# Patient Record
Sex: Female | Born: 1955 | Race: White | Hispanic: No | Smoking: Former smoker
Health system: Southern US, Community
[De-identification: ages and names within clinical notes are randomized; demographics above are authoritative.]

## PROBLEM LIST (undated history)

## (undated) DIAGNOSIS — I10 Essential (primary) hypertension: Secondary | ICD-10-CM

## (undated) DIAGNOSIS — G43909 Migraine, unspecified, not intractable, without status migrainosus: Secondary | ICD-10-CM

---

## 1999-03-10 ENCOUNTER — Ambulatory Visit (HOSPITAL_COMMUNITY): Admission: RE | Admit: 1999-03-10 | Discharge: 1999-03-10 | Payer: Self-pay | Admitting: *Deleted

## 2002-04-25 ENCOUNTER — Other Ambulatory Visit: Admission: RE | Admit: 2002-04-25 | Discharge: 2002-04-25 | Payer: Self-pay | Admitting: Family Medicine

## 2014-02-09 ENCOUNTER — Emergency Department (HOSPITAL_COMMUNITY): Payer: Self-pay

## 2014-02-09 ENCOUNTER — Inpatient Hospital Stay (HOSPITAL_COMMUNITY)
Admission: EM | Admit: 2014-02-09 | Discharge: 2014-02-11 | DRG: 066 | Disposition: A | Discharge status: Home / Self Care | Payer: Self-pay | Attending: Internal Medicine | Admitting: Internal Medicine

## 2014-02-09 ENCOUNTER — Encounter (HOSPITAL_COMMUNITY): Payer: Self-pay | Admitting: Emergency Medicine

## 2014-02-09 DIAGNOSIS — R209 Unspecified disturbances of skin sensation: Secondary | ICD-10-CM

## 2014-02-09 DIAGNOSIS — E785 Hyperlipidemia, unspecified: Secondary | ICD-10-CM

## 2014-02-09 DIAGNOSIS — I6350 Cerebral infarction due to unspecified occlusion or stenosis of unspecified cerebral artery: Secondary | ICD-10-CM

## 2014-02-09 DIAGNOSIS — M6281 Muscle weakness (generalized): Secondary | ICD-10-CM

## 2014-02-09 DIAGNOSIS — I10 Essential (primary) hypertension: Secondary | ICD-10-CM

## 2014-02-09 DIAGNOSIS — IMO0001 Reserved for inherently not codable concepts without codable children: Secondary | ICD-10-CM

## 2014-02-09 DIAGNOSIS — I6789 Other cerebrovascular disease: Secondary | ICD-10-CM | POA: Diagnosis present

## 2014-02-09 DIAGNOSIS — I639 Cerebral infarction, unspecified: Secondary | ICD-10-CM | POA: Insufficient documentation

## 2014-02-09 DIAGNOSIS — Z79899 Other long term (current) drug therapy: Secondary | ICD-10-CM

## 2014-02-09 DIAGNOSIS — R269 Unspecified abnormalities of gait and mobility: Secondary | ICD-10-CM | POA: Diagnosis present

## 2014-02-09 DIAGNOSIS — D72829 Elevated white blood cell count, unspecified: Secondary | ICD-10-CM | POA: Diagnosis present

## 2014-02-09 DIAGNOSIS — Z87891 Personal history of nicotine dependence: Secondary | ICD-10-CM

## 2014-02-09 DIAGNOSIS — M791 Myalgia, unspecified site: Secondary | ICD-10-CM

## 2014-02-09 DIAGNOSIS — E876 Hypokalemia: Secondary | ICD-10-CM | POA: Diagnosis present

## 2014-02-09 HISTORY — DX: Migraine, unspecified, not intractable, without status migrainosus: G43.909

## 2014-02-09 HISTORY — DX: Essential (primary) hypertension: I10

## 2014-02-09 LAB — I-STAT CHEM 8, ED
BUN: 13 mg/dL (ref 6–23)
CALCIUM ION: 1.16 mmol/L (ref 1.12–1.23)
CREATININE: 0.9 mg/dL (ref 0.50–1.10)
Chloride: 96 mEq/L (ref 96–112)
GLUCOSE: 107 mg/dL — AB (ref 70–99)
HCT: 48 % — ABNORMAL HIGH (ref 36.0–46.0)
HEMOGLOBIN: 16.3 g/dL — AB (ref 12.0–15.0)
Potassium: 3.2 mEq/L — ABNORMAL LOW (ref 3.7–5.3)
Sodium: 139 mEq/L (ref 137–147)
TCO2: 27 mmol/L (ref 0–100)

## 2014-02-09 LAB — COMPREHENSIVE METABOLIC PANEL
ALT: 24 U/L (ref 0–35)
AST: 16 U/L (ref 0–37)
Albumin: 3.8 g/dL (ref 3.5–5.2)
Alkaline Phosphatase: 115 U/L (ref 39–117)
BUN: 13 mg/dL (ref 6–23)
CALCIUM: 9.7 mg/dL (ref 8.4–10.5)
CO2: 29 mEq/L (ref 19–32)
Chloride: 96 mEq/L (ref 96–112)
Creatinine, Ser: 0.82 mg/dL (ref 0.50–1.10)
GFR calc non Af Amer: 79 mL/min — ABNORMAL LOW (ref >90)
GLUCOSE: 106 mg/dL — AB (ref 70–99)
POTASSIUM: 3.3 meq/L — AB (ref 3.7–5.3)
Sodium: 138 mEq/L (ref 137–147)
Total Bilirubin: 0.7 mg/dL (ref 0.3–1.2)
Total Protein: 7.9 g/dL (ref 6.0–8.3)

## 2014-02-09 LAB — DIFFERENTIAL
Basophils Absolute: 0 10*3/uL (ref 0.0–0.1)
Basophils Relative: 0 % (ref 0–1)
EOS PCT: 1 % (ref 0–5)
Eosinophils Absolute: 0.1 10*3/uL (ref 0.0–0.7)
LYMPHS ABS: 2.1 10*3/uL (ref 0.7–4.0)
LYMPHS PCT: 16 % (ref 12–46)
MONOS PCT: 6 % (ref 3–12)
Monocytes Absolute: 0.8 10*3/uL (ref 0.1–1.0)
Neutro Abs: 10.2 10*3/uL — ABNORMAL HIGH (ref 1.7–7.7)
Neutrophils Relative %: 78 % — ABNORMAL HIGH (ref 43–77)

## 2014-02-09 LAB — URINALYSIS, ROUTINE W REFLEX MICROSCOPIC
Bilirubin Urine: NEGATIVE
Glucose, UA: NEGATIVE mg/dL
Ketones, ur: NEGATIVE mg/dL
LEUKOCYTES UA: NEGATIVE
Nitrite: NEGATIVE
Protein, ur: 30 mg/dL — AB
Specific Gravity, Urine: 1.013 (ref 1.005–1.030)
Urobilinogen, UA: 0.2 mg/dL (ref 0.0–1.0)
pH: 6.5 (ref 5.0–8.0)

## 2014-02-09 LAB — CBC
HEMATOCRIT: 44.4 % (ref 36.0–46.0)
HEMOGLOBIN: 15.2 g/dL — AB (ref 12.0–15.0)
MCH: 29 pg (ref 26.0–34.0)
MCHC: 34.2 g/dL (ref 30.0–36.0)
MCV: 84.6 fL (ref 78.0–100.0)
PLATELETS: 274 10*3/uL (ref 150–400)
RBC: 5.25 MIL/uL — AB (ref 3.87–5.11)
RDW: 14.8 % (ref 11.5–15.5)
WBC: 13.1 10*3/uL — AB (ref 4.0–10.5)

## 2014-02-09 LAB — I-STAT TROPONIN, ED: Troponin i, poc: 0.02 ng/mL (ref 0.00–0.08)

## 2014-02-09 LAB — URINE MICROSCOPIC-ADD ON

## 2014-02-09 LAB — APTT: aPTT: 32 seconds (ref 24–37)

## 2014-02-09 LAB — RAPID URINE DRUG SCREEN, HOSP PERFORMED
Amphetamines: NOT DETECTED
Barbiturates: NOT DETECTED
Benzodiazepines: NOT DETECTED
Cocaine: NOT DETECTED
OPIATES: NOT DETECTED
Tetrahydrocannabinol: NOT DETECTED

## 2014-02-09 LAB — PROTIME-INR
INR: 0.96 (ref 0.00–1.49)
Prothrombin Time: 12.6 seconds (ref 11.6–15.2)

## 2014-02-09 LAB — ETHANOL: Alcohol, Ethyl (B): <11 mg/dL (ref 0–11)

## 2014-02-09 LAB — CBG MONITORING, ED: GLUCOSE-CAPILLARY: 119 mg/dL — AB (ref 70–99)

## 2014-02-09 MED ORDER — ENOXAPARIN SODIUM 40 MG/0.4ML ~~LOC~~ SOLN
40.0000 mg | SUBCUTANEOUS | Status: DC
Start: 2014-02-10 — End: 2014-02-11
  Administered 2014-02-10 – 2014-02-11 (×2): 40 mg via SUBCUTANEOUS
  Filled 2014-02-09 (×2): qty 0.4

## 2014-02-09 MED ORDER — LORAZEPAM 2 MG/ML IJ SOLN
1.0000 mg | INTRAMUSCULAR | Status: DC | PRN
  Administered 2014-02-09: 1 mg via INTRAVENOUS
  Filled 2014-02-09: qty 1

## 2014-02-09 MED ORDER — INFLUENZA VAC SPLIT QUAD 0.5 ML IM SUSP
0.5000 mL | INTRAMUSCULAR | Status: AC
  Administered 2014-02-10: 0.5 mL via INTRAMUSCULAR
  Filled 2014-02-09: qty 0.5

## 2014-02-09 MED ORDER — ACETAMINOPHEN 325 MG PO TABS: 650.0000 mg | ORAL_TABLET | ORAL | Status: DC | PRN

## 2014-02-09 MED ORDER — POTASSIUM CHLORIDE CRYS ER 20 MEQ PO TBCR
40.0000 meq | EXTENDED_RELEASE_TABLET | Freq: Once | ORAL | Status: AC
  Administered 2014-02-10: 40 meq via ORAL
  Filled 2014-02-09: qty 2

## 2014-02-09 MED ORDER — ACETAMINOPHEN 650 MG RE SUPP: 650.0000 mg | RECTAL | Status: DC | PRN

## 2014-02-09 MED ORDER — ASPIRIN EC 325 MG PO TBEC
325.0000 mg | DELAYED_RELEASE_TABLET | Freq: Every day | ORAL | Status: DC
  Administered 2014-02-10 – 2014-02-11 (×2): 325 mg via ORAL
  Filled 2014-02-09 (×2): qty 1

## 2014-02-09 MED ORDER — SIMVASTATIN 20 MG PO TABS
20.0000 mg | ORAL_TABLET | Freq: Every day | ORAL | Status: DC
  Filled 2014-02-09: qty 1

## 2014-02-09 MED ORDER — ASPIRIN 81 MG PO CHEW
324.0000 mg | CHEWABLE_TABLET | Freq: Once | ORAL | Status: AC
  Administered 2014-02-09: 324 mg via ORAL
  Filled 2014-02-09: qty 4

## 2014-02-09 MED ORDER — SENNOSIDES-DOCUSATE SODIUM 8.6-50 MG PO TABS: 1.0000 | ORAL_TABLET | Freq: Every evening | ORAL | Status: DC | PRN

## 2014-02-09 NOTE — ED Provider Notes (Signed)
CSN: [id number]     Arrival date & time 02/09/14  1310 History   First MD Initiated Contact with Patient 02/09/14 1350     Chief Complaint  Patient presents with  . Extremity Weakness     HPI  Patient presents with left upper extremity numbness, and left leg "coordination problem".  Her last time no normal was 9:30 this morning. She awakened prior to this. She had made and eaten breakfast. She then laid down to read which is her norm. At 9:30 she got up. She states she had difficulty with feeling in her arm. Also felt that it felt a little clumsy to her. By about 12:15 she was describing difficulty with use of her legs. Describing them "like they were drunk". She now states this was almost exclusively her left lower extremity and her right leg is held normal. She called paramedics. She was transferred here. She states that she was able to ambulate to the ambulance. However, upon arrival here she states she had to be brought in in a wheelchair. Since arrival here, she has been able to ambulate to the bathroom and states that her symptoms are better but not resolved. She has a slight headache like a rubber band over the top of her head. No palpitations. No head or neck injuries. No chest pain or palpitations. History of an episode of weakness with a migraine headache in her 75s. No similar symptoms since. She had a mild headache and she considered a migraine on Monday that she stayed home from work with.  Past Medical History  Diagnosis Date  . Migraine   . Hypertension    History reviewed. No pertinent past surgical history. Family History  Problem Relation Age of Onset  . Migraines Mother   . Hypertension Mother   . Hypertension Father    History  Substance Use Topics  . Smoking status: Former Games developer  . Smokeless tobacco: Never Used  . Alcohol Use: Yes     Comment: occasional   OB History   Grav Para Term Preterm Abortions TAB SAB Ect Mult Living                 Review of Systems   Constitutional: Negative for fever, chills, diaphoresis, appetite change and fatigue.  HENT: Negative for mouth sores, sore throat and trouble swallowing.   Eyes: Negative for visual disturbance.  Respiratory: Negative for cough, chest tightness, shortness of breath and wheezing.   Cardiovascular: Negative for chest pain.  Gastrointestinal: Negative for nausea, vomiting, abdominal pain, diarrhea and abdominal distention.  Endocrine: Negative for polydipsia, polyphagia and polyuria.  Genitourinary: Negative for dysuria, frequency and hematuria.  Musculoskeletal: Negative for gait problem.  Skin: Negative for color change, pallor and rash.  Neurological: Positive for weakness, numbness and headaches. Negative for dizziness, syncope and light-headedness.  Hematological: Does not bruise/bleed easily.  Psychiatric/Behavioral: Negative for behavioral problems and confusion.      Allergies  Review of patient's allergies indicates no known allergies.  Home Medications   No current outpatient prescriptions on file. BP 144/92  Pulse 103  Temp(Src) 98 F (36.7 C) (Oral)  Resp 18  Ht 5' (1.524 m)  Wt 175 lb 14.4 oz (79.788 kg)  BMI 34.35 kg/m2  SpO2 97%  LMP 11/28/2013 Physical Exam  Constitutional: She is oriented to person, place, and time. She appears well-developed and well-nourished. No distress.  HENT:  Head: Normocephalic.  Eyes: Conjunctivae are normal. Pupils are equal, round, and reactive to light. No  scleral icterus.  Neck: Normal range of motion. Neck supple. No thyromegaly present.  Cardiovascular: Normal rate and regular rhythm.  Exam reveals no gallop and no friction rub.   No murmur heard. Pulmonary/Chest: Effort normal and breath sounds normal. No respiratory distress. She has no wheezes. She has no rales.  Abdominal: Soft. Bowel sounds are normal. She exhibits no distension. There is no tenderness. There is no rebound.  Musculoskeletal: Normal range of motion.   Neurological: She is alert and oriented to person, place, and time. A sensory deficit is present. Coordination and gait abnormal.  Cranial nerves intact symmetric. No carotid bruits. Subtle left upper extremity pronator drift. Describes numbness in her left fourth digit. Normal rapid alternating movement of her extremities. She has normal gross strength of her lower extremities symmetrically. She has difficulty with cerebellar function of her left leg. With attempted gait she has great difficulty with tandem gait, and ataxia with ambulation.  Skin: Skin is warm and dry. No rash noted.  Psychiatric: She has a normal mood and affect. Her behavior is normal.    ED Course  Procedures (including critical care time) Labs Review Labs Reviewed  CBC - Abnormal; Notable for the following:    WBC 13.1 (*)    RBC 5.25 (*)    Hemoglobin 15.2 (*)    All other components within normal limits  DIFFERENTIAL - Abnormal; Notable for the following:    Neutrophils Relative % 78 (*)    Neutro Abs 10.2 (*)    All other components within normal limits  COMPREHENSIVE METABOLIC PANEL - Abnormal; Notable for the following:    Potassium 3.3 (*)    Glucose, Bld 106 (*)    GFR calc non Af Amer 79 (*)    All other components within normal limits  URINALYSIS, ROUTINE W REFLEX MICROSCOPIC - Abnormal; Notable for the following:    Hgb urine dipstick SMALL (*)    Protein, ur 30 (*)    All other components within normal limits  URINE MICROSCOPIC-ADD ON - Abnormal; Notable for the following:    Squamous Epithelial / LPF FEW (*)    Bacteria, UA FEW (*)    Casts HYALINE CASTS (*)    All other components within normal limits  HEMOGLOBIN A1C - Abnormal; Notable for the following:    Hemoglobin A1C 6.0 (*)    Mean Plasma Glucose 126 (*)    All other components within normal limits  LIPID PANEL - Abnormal; Notable for the following:    Cholesterol 247 (*)    LDL Cholesterol 164 (*)    All other components within  normal limits  CBC WITH DIFFERENTIAL - Abnormal; Notable for the following:    WBC 12.0 (*)    RBC 5.12 (*)    Neutro Abs 7.9 (*)    All other components within normal limits  COMPREHENSIVE METABOLIC PANEL - Abnormal; Notable for the following:    Potassium 3.4 (*)    Chloride 95 (*)    Glucose, Bld 117 (*)    Albumin 3.3 (*)    GFR calc non Af Amer 79 (*)    All other components within normal limits  C-REACTIVE PROTEIN - Abnormal; Notable for the following:    CRP 2.0 (*)    All other components within normal limits  I-STAT CHEM 8, ED - Abnormal; Notable for the following:    Potassium 3.2 (*)    Glucose, Bld 107 (*)    Hemoglobin 16.3 (*)  HCT 48.0 (*)    All other components within normal limits  CBG MONITORING, ED - Abnormal; Notable for the following:    Glucose-Capillary 119 (*)    All other components within normal limits  ETHANOL  PROTIME-INR  URINE RAPID DRUG SCREEN (HOSP PERFORMED)  APTT  PROTIME-INR  SEDIMENTATION RATE  ANA  I-STAT TROPOININ, ED   Imaging Review Ct Head Wo Contrast  02/09/2014   CLINICAL DATA:  Left-sided weakness  EXAM: CT HEAD WITHOUT CONTRAST  TECHNIQUE: Contiguous axial images were obtained from the base of the skull through the vertex without intravenous contrast.  COMPARISON:  None.  FINDINGS: The brainstem, cerebellum, cerebral peduncles, thalamus, basal ganglia, basilar cisterns, and ventricular system appear within normal limits. No intracranial hemorrhage, mass lesion, or acute CVA.  IMPRESSION: No significant abnormality identified.   Electronically Signed   By: Herbie BaltimoreWalt  Liebkemann M.D.   On: 02/09/2014 15:40   Mr Maxine GlennMra Head Wo Contrast  02/09/2014   CLINICAL DATA:  Headache. Hypertension. Left lower extremity weakness.  EXAM: MRI HEAD WITHOUT CONTRAST  MRA HEAD WITHOUT CONTRAST  TECHNIQUE: Multiplanar, multiecho pulse sequences of the brain and surrounding structures were obtained without intravenous contrast. Angiographic images of the head  were obtained using MRA technique without contrast.  COMPARISON:  CT head without contrast 02/09/2014.  FINDINGS: MRI HEAD FINDINGS  An acute nonhemorrhagic infarct is present within the white matter of the posterior right corona radiata. This likely corresponds to the cortical spinal tracts. There is no associated hemorrhage. Minimal white matter changes evident. No other significant white matter disease is present. Midline structures are within normal limits.  Flow is present in the major intracranial arteries. The globes and orbits are intact. The paranasal sinuses and mastoid air cells are clear.  MRA HEAD FINDINGS  The internal carotid arteries are within normal limits from the high cervical segments through the ICA termini bilaterally. The A1 and M1 segments are normal. The anterior communicating artery is patent. The MCA bifurcations are within normal limits. MCA and ACA branch vessels are normal bilaterally.  The right vertebral artery is dominant. The PICA origins are visualized and normal bilaterally. A prominent right AICA is present. The basilar artery is within normal limits. Both posterior cerebral arteries originate from the basilar tip. There is mild attenuation of right PCA branch vessels.  IMPRESSION: 1. Acute nonhemorrhagic infarct involving the white matter in the posterior right corona radiata, likely affecting the cortical spinal tracts. 2. No other significant infarct or white matter disease. 3. Minimal distal small vessel disease.  Critical Value/emergent results were called by telephone at the time of interpretation on 02/09/2014 at 6:37 PM to Dr. Rolland PorterMARK Kolten Ryback , who verbally acknowledged these results.   Electronically Signed   By: Gennette Pachris  Mattern M.D.   On: 02/09/2014 18:37   Mr Brain Wo Contrast  02/09/2014   CLINICAL DATA:  Headache. Hypertension. Left lower extremity weakness.  EXAM: MRI HEAD WITHOUT CONTRAST  MRA HEAD WITHOUT CONTRAST  TECHNIQUE: Multiplanar, multiecho pulse sequences of  the brain and surrounding structures were obtained without intravenous contrast. Angiographic images of the head were obtained using MRA technique without contrast.  COMPARISON:  CT head without contrast 02/09/2014.  FINDINGS: MRI HEAD FINDINGS  An acute nonhemorrhagic infarct is present within the white matter of the posterior right corona radiata. This likely corresponds to the cortical spinal tracts. There is no associated hemorrhage. Minimal white matter changes evident. No other significant white matter disease is present. Midline structures are  within normal limits.  Flow is present in the major intracranial arteries. The globes and orbits are intact. The paranasal sinuses and mastoid air cells are clear.  MRA HEAD FINDINGS  The internal carotid arteries are within normal limits from the high cervical segments through the ICA termini bilaterally. The A1 and M1 segments are normal. The anterior communicating artery is patent. The MCA bifurcations are within normal limits. MCA and ACA branch vessels are normal bilaterally.  The right vertebral artery is dominant. The PICA origins are visualized and normal bilaterally. A prominent right AICA is present. The basilar artery is within normal limits. Both posterior cerebral arteries originate from the basilar tip. There is mild attenuation of right PCA branch vessels.  IMPRESSION: 1. Acute nonhemorrhagic infarct involving the white matter in the posterior right corona radiata, likely affecting the cortical spinal tracts. 2. No other significant infarct or white matter disease. 3. Minimal distal small vessel disease.  Critical Value/emergent results were called by telephone at the time of interpretation on 02/09/2014 at 6:37 PM to Dr. Rolland Porter , who verbally acknowledged these results.   Electronically Signed   By: Gennette Pac M.D.   On: 02/09/2014 18:37     EKG Interpretation   Date/Time:  Friday February 09 2014 16:01:58 EST Ventricular Rate:  89 PR  Interval:  116 QRS Duration: 90 QT Interval:  381 QTC Calculation: 464 R Axis:   54 Text Interpretation:  Sinus rhythm  No  ectopy No injury Confirmed by  Fayrene Fearing  MD, Carrington Mullenax ([id number]) on 02/09/2014 4:18:20 PM      MDM   Final diagnoses:  CVA (cerebral infarction)    MRI shows CVA.  Discussed with Neurology, and [facility].  Pt seen by Dr. Leroy Kennedy after arrival.  Pt not TPA candidate because out of therapeutic window.    Rolland Porter, MD 02/10/14 [phone]

## 2014-02-09 NOTE — ED Notes (Signed)
Pt presents via [facility] from home with c/o feeling numbness and tingling in bilateral legs starting around 9:30am.  Pt states she had a "drunk feeling" in her legs.

## 2014-02-09 NOTE — ED Notes (Signed)
MRA ordered due to MRI showing stroke.

## 2014-02-09 NOTE — ED Notes (Signed)
Pt reporting a headache.  Sts this headache does not feel like a migraine.

## 2014-02-09 NOTE — ED Notes (Signed)
Pt mildly anxious about MRI.  Pt to be premedicated before MRI and accompanied by RN to MRI if pt needs more medication during exam.  Pt verbalized understanding of plan of care.

## 2014-02-09 NOTE — ED Notes (Signed)
Pt transported to MRI 

## 2014-02-09 NOTE — Consult Note (Signed)
Referring Physician: Fayrene Fearing    Chief Complaint: Left sided numbness  HPI:                                                                                                                                         Olivia Jefferson is an 58 y.o. female who has a history of complicated migraine 20 years ago.  At that time the symptoms consisted of left arm weakness and bilateral leg weakness. Today she awoke feeling normal but around 0930 she noted her left arm was "tingling" and around 10:10 she noted she felt off balance and left leg seemed slightly "tingly".  Due to these symptoms she called EMS.  On arrival patient was brought to CT scanner where code stroke was called.  Currently she still feels her left arm and leg have decreased sensation but on formal testing she felt they were the same.  NIHSS 0. Patient is very anxious.   Date last known well: Date: 02/09/2014 Time last known well: Time: 09:30 tPA Given: No: out of the window  Past Medical History  Diagnosis Date  . Migraine     History reviewed. No pertinent past surgical history.  Family History  Problem Relation Age of Onset  . Migraines Mother   . Hypertension Mother   . Hypertension Father    Social History:  reports that she has quit smoking. She has never used smokeless tobacco. She reports that she drinks alcohol. She reports that she does not use illicit drugs.  Allergies: No Known Allergies  Medications:                                                                                                                           No current facility-administered medications for this encounter.   Current Outpatient Prescriptions  Medication Sig Dispense Refill  . triamterene-hydrochlorothiazide (DYAZIDE) 37.5-25 MG per capsule Take 1 capsule by mouth daily.         ROS:  History obtained from  the patient  General ROS: negative for - chills, fatigue, fever, night sweats, weight gain or weight loss Psychological ROS: negative for - behavioral disorder, hallucinations, memory difficulties, mood swings or suicidal ideation Ophthalmic ROS: negative for - blurry vision, double vision, eye pain or loss of vision ENT ROS: negative for - epistaxis, nasal discharge, oral lesions, sore throat, tinnitus or vertigo Allergy and Immunology ROS: negative for - hives or itchy/watery eyes Hematological and Lymphatic ROS: negative for - bleeding problems, bruising or swollen lymph nodes Endocrine ROS: negative for - galactorrhea, hair pattern changes, polydipsia/polyuria or temperature intolerance Respiratory ROS: negative for - cough, hemoptysis, shortness of breath or wheezing Cardiovascular ROS: negative for - chest pain, dyspnea on exertion, edema or irregular heartbeat Gastrointestinal ROS: negative for - abdominal pain, diarrhea, hematemesis, nausea/vomiting or stool incontinence Genito-Urinary ROS: negative for - dysuria, hematuria, incontinence or urinary frequency/urgency Musculoskeletal ROS: negative for - joint swelling or muscular weakness Neurological ROS: as noted in HPI Dermatological ROS: negative for rash and skin lesion changes  Neurologic Examination:                                                                                                      Blood pressure 188/93, pulse 91, resp. rate 16, height 5' (1.524 m), weight 74.844 kg (165 lb), last menstrual period 11/28/2013, SpO2 97.00%.   Mental Status: Alert, oriented, thought content appropriate.  Speech fluent without evidence of aphasia.  Able to follow 3 step commands without difficulty. Cranial Nerves: II: Discs flat bilaterally; Visual fields grossly normal, pupils equal, round, reactive to light and accommodation III,IV, VI: ptosis not present, extra-ocular motions intact bilaterally V,VII: smile symmetric, facial  light touch sensation normal bilaterally VIII: hearing normal bilaterally IX,X: gag reflex present XI: bilateral shoulder shrug XII: midline tongue extension without atrophy or fasciculations  Motor: Right : Upper extremity   5/5    Left:     Upper extremity   5/5  Lower extremity   5/5     Lower extremity   5/5 Tone and bulk:normal tone throughout; no atrophy noted Sensory: Pinprick and light touch intact throughout, bilaterally Deep Tendon Reflexes:  Right: Upper Extremity   Left: Upper extremity   biceps (C-5 to C-6) 2/4   biceps (C-5 to C-6) 2/4 tricep (C7) 2/4    triceps (C7) 2/4 Brachioradialis (C6) 2/4  Brachioradialis (C6) 2/4  Lower Extremity Lower Extremity  quadriceps (L-2 to L-4) 2/4   quadriceps (L-2 to L-4) 2/4 Achilles (S1) 2/4   Achilles (S1) 2/4  Plantars: Right: downgoing   Left: downgoing Cerebellar: normal finger-to-nose,  normal heel-to-shin test Gait: slightly ataxic CV: pulses palpable throughout    Lab Results: Basic Metabolic Panel:  Recent Labs Lab 02/09/14 1545  NA 139  K 3.2*  CL 96  GLUCOSE 107*  BUN 13  CREATININE 0.90    Liver Function Tests: No results found for this basename: AST, ALT, ALKPHOS, BILITOT, PROT, ALBUMIN,  in the last 168 hours No results found for this basename: LIPASE, AMYLASE,  in the last  168 hours No results found for this basename: AMMONIA,  in the last 168 hours  CBC:  Recent Labs Lab 02/09/14 1545  HGB 16.3*  HCT 48.0*    Cardiac Enzymes: No results found for this basename: CKTOTAL, CKMB, CKMBINDEX, TROPONINI,  in the last 168 hours  Lipid Panel: No results found for this basename: CHOL, TRIG, HDL, CHOLHDL, VLDL, LDLCALC,  in the last 168 hours  CBG: No results found for this basename: GLUCAP,  in the last 168 hours  Microbiology: No results found for this or any previous visit.  Coagulation Studies: No results found for this basename: LABPROT, INR,  in the last 72 hours  Imaging: Ct Head  Wo Contrast  02/09/2014   CLINICAL DATA:  Left-sided weakness  EXAM: CT HEAD WITHOUT CONTRAST  TECHNIQUE: Contiguous axial images were obtained from the base of the skull through the vertex without intravenous contrast.  COMPARISON:  None.  FINDINGS: The brainstem, cerebellum, cerebral peduncles, thalamus, basal ganglia, basilar cisterns, and ventricular system appear within normal limits. No intracranial hemorrhage, mass lesion, or acute CVA.  IMPRESSION: No significant abnormality identified.   Electronically Signed   By: Herbie Baltimore M.D.   On: 02/09/2014 15:40       Assessment and plan discussed with with attending physician and they are in agreement.    Felicie Morn PA-C [facility] [phone]  02/09/2014, 3:54 PM   Assessment: 58 y.o. female with onset of left arm and leg tingling which progressed to gait imbalance.  Patient does report having migraine HA (complicated) which presented much like today's symptoms but today she did not have floaters. Exam is non-focal with no lateralizing or localizing symptoms.  Likely a complicated migraine HA with component of anxiety however cannot rule out CVA.    Stroke Risk Factors - none  Recommend: 1) MRI brain to evaluate for CVA.  If negative would not continue with stroke work up.  2) If MRI is positive would continue with stroke work up and have hospitalist admit.    Patient seen and examined together with physician assistant and I concur with the assessment and plan.  Wyatt Portela, MD

## 2014-02-09 NOTE — ED Notes (Signed)
Pt back from CT

## 2014-02-09 NOTE — ED Notes (Signed)
Pt states she feels heavy in her left leg, "I feel clumsy".  Strength is moderate to strong, able to follow commands, absent drift bilateral extremities.

## 2014-02-09 NOTE — H&P (Signed)
[facility] History and Physical  Patient: Olivia Jefferson  SFK:[id number]  DOB: 1956/08/29  DOS: the patient was seen and examined on 02/09/2014 PCP: Hulen Shouts, MD  Chief Complaint: left sided numbness  HPI: Olivia Jefferson is a 58 y.o. female with Past medical history of hypertension, dyslipidemia, myalgia on statin. The patient is coming from home. The pt presented with complain of left sided numbness, that she noted earlier this morning at 9:30 AM. When she woke up she was at her baseline and did not have any complaint. Her symptoms initially started in the left upper extremity with numbness and she tried to shake it off but his symptoms were not getting better and then while she was going to the bathroom she noticed that she had difficulty ambulating due to weakness and numbness in her legs. She denied any dizziness, headache, blurring of vision, difficulty swallowing, chest pain, palpitation, shortness of breath, fever, chills, burning urination, diarrhea, rash. She has history of rosacea and has photosensitivity. She denies any similar symptoms in the past. She has history of hypertension and is taking her medication, she was on pravastatin which she stopped a few months ago due to myalgia and generalized fatigue and difficulty maintaining coordination and declining cognitive function. She after stopping the medication her symptoms improved. She has family history of strokes.  Review of Systems: as mentioned in the history of present illness.  A Comprehensive review of the other systems is negative.  Past Medical History  Diagnosis Date  . Migraine   . Hypertension    History reviewed. No pertinent past surgical history. Social History:  reports that she has quit smoking. She has never used smokeless tobacco. She reports that she drinks alcohol. She reports that she does not use illicit drugs. Independent for most of her  ADL.  No Known Allergies  Family History   Problem Relation Age of Onset  . Migraines Mother   . Hypertension Mother   . Hypertension Father     Prior to Admission medications   Medication Sig Start Date End Date Taking? Authorizing Provider  triamterene-hydrochlorothiazide (DYAZIDE) 37.5-25 MG per capsule Take 1 capsule by mouth daily.   Yes Historical Provider, MD    Physical Exam: Filed Vitals:   02/09/14 1805 02/09/14 1830 02/09/14 1850 02/09/14 1900  BP: 153/92 151/88 147/95 145/90  Pulse: 94 94  96  Resp: $Remo'19 21 15 23  'RQVIK$ Height:      Weight:      SpO2: 96% 94% 96% 95%    General: Alert, Awake and Oriented to Time, Place and Person. Appear in mild distress Eyes: PERRL ENT: Oral Mucosa clear moist. Neck: no JVD Cardiovascular: S1 and S2 Present, no Murmur, Peripheral Pulses Present Respiratory: Bilateral Air entry equal and Decreased, Clear to Auscultation,  no Crackles,no wheezes Abdomen: Bowel Sound Present, Soft and Non tender Skin: no Rash Extremities: no Pedal edema, no calf tenderness Neurologic: Alert awake and oriented appropriate speech, Cranial Nerves pupils are reactive extraocular muscle movement intact, Motor strength bilaterally equal strength, Sensation present to light touch bilaterally, reflexes intact bilaterally, babinski negative, Proprioception normal, Cerebellar test normal finger-nose-finger with difficulty on the left.  Labs on Admission:  CBC:  Recent Labs Lab 02/09/14 1417 02/09/14 1545  WBC 13.1*  --   NEUTROABS 10.2*  --   HGB 15.2* 16.3*  HCT 44.4 48.0*  MCV 84.6  --   PLT 274  --     CMP     Component Value  Date/Time   NA 139 02/09/2014 1545   K 3.2* 02/09/2014 1545   CL 96 02/09/2014 1545   CO2 29 02/09/2014 1417   GLUCOSE 107* 02/09/2014 1545   BUN 13 02/09/2014 1545   CREATININE 0.90 02/09/2014 1545   CALCIUM 9.7 02/09/2014 1417   PROT 7.9 02/09/2014 1417   ALBUMIN 3.8 02/09/2014 1417   AST 16 02/09/2014 1417   ALT 24 02/09/2014 1417   ALKPHOS 115 02/09/2014 1417    BILITOT 0.7 02/09/2014 1417   GFRNONAA 79* 02/09/2014 1417   GFRAA >90 02/09/2014 1417    No results found for this basename: LIPASE, AMYLASE,  in the last 168 hours No results found for this basename: AMMONIA,  in the last 168 hours  No results found for this basename: CKTOTAL, CKMB, CKMBINDEX, TROPONINI,  in the last 168 hours BNP (last 3 results) No results found for this basename: PROBNP,  in the last 8760 hours  Radiological Exams on Admission: Ct Head Wo Contrast  02/09/2014   CLINICAL DATA:  Left-sided weakness  EXAM: CT HEAD WITHOUT CONTRAST  TECHNIQUE: Contiguous axial images were obtained from the base of the skull through the vertex without intravenous contrast.  COMPARISON:  None.  FINDINGS: The brainstem, cerebellum, cerebral peduncles, thalamus, basal ganglia, basilar cisterns, and ventricular system appear within normal limits. No intracranial hemorrhage, mass lesion, or acute CVA.  IMPRESSION: No significant abnormality identified.   Electronically Signed   By: Sherryl Barters M.D.   On: 02/09/2014 15:40   Mr Jodene Nam Head Wo Contrast  02/09/2014   CLINICAL DATA:  Headache. Hypertension. Left lower extremity weakness.  EXAM: MRI HEAD WITHOUT CONTRAST  MRA HEAD WITHOUT CONTRAST  TECHNIQUE: Multiplanar, multiecho pulse sequences of the brain and surrounding structures were obtained without intravenous contrast. Angiographic images of the head were obtained using MRA technique without contrast.  COMPARISON:  CT head without contrast 02/09/2014.  FINDINGS: MRI HEAD FINDINGS  An acute nonhemorrhagic infarct is present within the white matter of the posterior right corona radiata. This likely corresponds to the cortical spinal tracts. There is no associated hemorrhage. Minimal white matter changes evident. No other significant white matter disease is present. Midline structures are within normal limits.  Flow is present in the major intracranial arteries. The globes and orbits are intact. The  paranasal sinuses and mastoid air cells are clear.  MRA HEAD FINDINGS  The internal carotid arteries are within normal limits from the high cervical segments through the ICA termini bilaterally. The A1 and M1 segments are normal. The anterior communicating artery is patent. The MCA bifurcations are within normal limits. MCA and ACA branch vessels are normal bilaterally.  The right vertebral artery is dominant. The PICA origins are visualized and normal bilaterally. A prominent right AICA is present. The basilar artery is within normal limits. Both posterior cerebral arteries originate from the basilar tip. There is mild attenuation of right PCA branch vessels.  IMPRESSION: 1. Acute nonhemorrhagic infarct involving the white matter in the posterior right corona radiata, likely affecting the cortical spinal tracts. 2. No other significant infarct or white matter disease. 3. Minimal distal small vessel disease.  Critical Value/emergent results were called by telephone at the time of interpretation on 02/09/2014 at 6:37 PM to Dr. Tanna Furry , who verbally acknowledged these results.   Electronically Signed   By: Lawrence Santiago M.D.   On: 02/09/2014 18:37   Mr Brain Wo Contrast  02/09/2014   CLINICAL DATA:  Headache. Hypertension. Left lower  extremity weakness.  EXAM: MRI HEAD WITHOUT CONTRAST  MRA HEAD WITHOUT CONTRAST  TECHNIQUE: Multiplanar, multiecho pulse sequences of the brain and surrounding structures were obtained without intravenous contrast. Angiographic images of the head were obtained using MRA technique without contrast.  COMPARISON:  CT head without contrast 02/09/2014.  FINDINGS: MRI HEAD FINDINGS  An acute nonhemorrhagic infarct is present within the white matter of the posterior right corona radiata. This likely corresponds to the cortical spinal tracts. There is no associated hemorrhage. Minimal white matter changes evident. No other significant white matter disease is present. Midline structures are  within normal limits.  Flow is present in the major intracranial arteries. The globes and orbits are intact. The paranasal sinuses and mastoid air cells are clear.  MRA HEAD FINDINGS  The internal carotid arteries are within normal limits from the high cervical segments through the ICA termini bilaterally. The A1 and M1 segments are normal. The anterior communicating artery is patent. The MCA bifurcations are within normal limits. MCA and ACA branch vessels are normal bilaterally.  The right vertebral artery is dominant. The PICA origins are visualized and normal bilaterally. A prominent right AICA is present. The basilar artery is within normal limits. Both posterior cerebral arteries originate from the basilar tip. There is mild attenuation of right PCA branch vessels.  IMPRESSION: 1. Acute nonhemorrhagic infarct involving the white matter in the posterior right corona radiata, likely affecting the cortical spinal tracts. 2. No other significant infarct or white matter disease. 3. Minimal distal small vessel disease.  Critical Value/emergent results were called by telephone at the time of interpretation on 02/09/2014 at 6:37 PM to Dr. Tanna Furry , who verbally acknowledged these results.   Electronically Signed   By: Lawrence Santiago M.D.   On: 02/09/2014 18:37    EKG: Independently reviewed. normal EKG, normal sinus rhythm.  Assessment/Plan Principal Problem:   Acute CVA (cerebrovascular accident) Active Problems:   Hypertension   Dyslipidemia   Myalgia with statin   1. Acute CVA (cerebrovascular accident) The patient is presenting with complaints of left-sided numbness and weakness. Her CT of the head was negative but her MRI is positive for acute CVA in the right corona radiata involving the CST. Her symptoms are stable at present, and she is out of the window period for TPA. At present we would admit her for further stroke workup including echocardiogram, telemetry monitoring, carotid Doppler. I  would also check ESR CRP and ANA. She'll be on aspirin, I discussed with her about the introduction of statin and she agreed. Check lipid profile hemoglobin A1c in the morning. PTOT consultation.  2. Hypertension At present permissive hypertension Can resume antihypertensive tomorrow.  Consults: Neurology  DVT Prophylaxis: subcutaneous Heparin Nutrition: Advance of her stroke swallowing evaluation cardiac diet  Code Status: Full  Disposition: Admitted to inpatient in telemetry unit.  Author: Berle Mull, MD [facility] Pager: [phone] 02/09/2014, 9:42 PM    If 7PM-7AM, please contact night-coverage [email or web address] Password [email or web address]

## 2014-02-09 NOTE — ED Notes (Signed)
Stroke team and neurologist at bedside.

## 2014-02-10 DIAGNOSIS — I10 Essential (primary) hypertension: Secondary | ICD-10-CM

## 2014-02-10 DIAGNOSIS — I6350 Cerebral infarction due to unspecified occlusion or stenosis of unspecified cerebral artery: Principal | ICD-10-CM

## 2014-02-10 DIAGNOSIS — E785 Hyperlipidemia, unspecified: Secondary | ICD-10-CM

## 2014-02-10 LAB — CBC WITH DIFFERENTIAL/PLATELET
Basophils Absolute: 0 10*3/uL (ref 0.0–0.1)
Basophils Relative: 0 % (ref 0–1)
EOS ABS: 0.1 10*3/uL (ref 0.0–0.7)
Eosinophils Relative: 1 % (ref 0–5)
HCT: 43.7 % (ref 36.0–46.0)
Hemoglobin: 15 g/dL (ref 12.0–15.0)
LYMPHS ABS: 3.1 10*3/uL (ref 0.7–4.0)
LYMPHS PCT: 26 % (ref 12–46)
MCH: 29.3 pg (ref 26.0–34.0)
MCHC: 34.3 g/dL (ref 30.0–36.0)
MCV: 85.4 fL (ref 78.0–100.0)
Monocytes Absolute: 0.9 10*3/uL (ref 0.1–1.0)
Monocytes Relative: 7 % (ref 3–12)
NEUTROS PCT: 66 % (ref 43–77)
Neutro Abs: 7.9 10*3/uL — ABNORMAL HIGH (ref 1.7–7.7)
Platelets: 248 10*3/uL (ref 150–400)
RBC: 5.12 MIL/uL — ABNORMAL HIGH (ref 3.87–5.11)
RDW: 14.8 % (ref 11.5–15.5)
WBC: 12 10*3/uL — ABNORMAL HIGH (ref 4.0–10.5)

## 2014-02-10 LAB — LIPID PANEL
Cholesterol: 247 mg/dL — ABNORMAL HIGH (ref 0–200)
HDL: 54 mg/dL (ref >39)
LDL Cholesterol: 164 mg/dL — ABNORMAL HIGH (ref 0–99)
Total CHOL/HDL Ratio: 4.6 RATIO
Triglycerides: 145 mg/dL (ref <150)
VLDL: 29 mg/dL (ref 0–40)

## 2014-02-10 LAB — COMPREHENSIVE METABOLIC PANEL
ALT: 22 U/L (ref 0–35)
AST: 15 U/L (ref 0–37)
Albumin: 3.3 g/dL — ABNORMAL LOW (ref 3.5–5.2)
Alkaline Phosphatase: 107 U/L (ref 39–117)
BILIRUBIN TOTAL: 0.7 mg/dL (ref 0.3–1.2)
BUN: 14 mg/dL (ref 6–23)
CO2: 30 meq/L (ref 19–32)
Calcium: 9.6 mg/dL (ref 8.4–10.5)
Chloride: 95 mEq/L — ABNORMAL LOW (ref 96–112)
Creatinine, Ser: 0.82 mg/dL (ref 0.50–1.10)
GFR calc Af Amer: >90 mL/min (ref >90)
GFR, EST NON AFRICAN AMERICAN: 79 mL/min — AB (ref >90)
GLUCOSE: 117 mg/dL — AB (ref 70–99)
Potassium: 3.4 mEq/L — ABNORMAL LOW (ref 3.7–5.3)
Sodium: 139 mEq/L (ref 137–147)
Total Protein: 7.2 g/dL (ref 6.0–8.3)

## 2014-02-10 LAB — C-REACTIVE PROTEIN: CRP: 2 mg/dL — ABNORMAL HIGH (ref <0.60)

## 2014-02-10 LAB — SEDIMENTATION RATE: Sed Rate: 18 mm/hr (ref 0–22)

## 2014-02-10 LAB — HEMOGLOBIN A1C
HEMOGLOBIN A1C: 6 % — AB (ref <5.7)
MEAN PLASMA GLUCOSE: 126 mg/dL — AB (ref <117)

## 2014-02-10 LAB — PROTIME-INR
INR: 1 (ref 0.00–1.49)
PROTHROMBIN TIME: 13 s (ref 11.6–15.2)

## 2014-02-10 MED ORDER — SIMVASTATIN 10 MG PO TABS
10.0000 mg | ORAL_TABLET | Freq: Every day | ORAL | Status: DC
  Administered 2014-02-10 – 2014-02-11 (×2): 10 mg via ORAL
  Filled 2014-02-10 (×2): qty 1

## 2014-02-10 MED ORDER — POTASSIUM CHLORIDE CRYS ER 20 MEQ PO TBCR
40.0000 meq | EXTENDED_RELEASE_TABLET | Freq: Once | ORAL | Status: AC
  Administered 2014-02-10: 40 meq via ORAL
  Filled 2014-02-10: qty 2

## 2014-02-10 NOTE — Evaluation (Signed)
Physical Therapy Evaluation Patient Details Name: Olivia Jefferson MRN: [id number] DOB: 1956/04/24 Today's Date: 02/10/2014 Time: [id number] PT Time Calculation (min): 12 min  PT Assessment / Plan / Recommendation History of Present Illness  pt rpesents with L sided numbness.    Clinical Impression  Pt independent with all mobility and indicates only deficits are dullness in L ring finger and "slight sluggish" feeling in L LE, but is independent with all mobility.  No further therapy needs at this time.  Will sign off.      PT Assessment  Patent does not need any further PT services    Follow Up Recommendations  No PT follow up    Does the patient have the potential to tolerate intense rehabilitation      Barriers to Discharge        Equipment Recommendations  None recommended by PT    Recommendations for Other Services     Frequency      Precautions / Restrictions Precautions Precautions: None Restrictions Weight Bearing Restrictions: No   Pertinent Vitals/Pain Denied pain.        Mobility  Bed Mobility Overal bed mobility: Independent Transfers Overall transfer level: Independent Ambulation/Gait Ambulation/Gait assistance: Independent Ambulation Distance (Feet): 300 Feet Assistive device: None Gait Pattern/deviations: WFL(Within Functional Limits) Gait velocity interpretation: >2.62 ft/sec, indicative of independent community ambulator Stairs: Yes Stairs assistance: Modified independent (Device/Increase time) Stair Management: One rail Right;Forwards;Alternating pattern Number of Stairs: 11 Modified Rankin (Stroke Patients Only) Pre-Morbid Rankin Score: No symptoms Modified Rankin: No significant disability    Exercises     PT Diagnosis:    PT Problem List:   PT Treatment Interventions:       PT Goals(Current goals can be found in the care plan section) Acute Rehab PT Goals PT Goal Formulation: No goals set, d/c therapy  Visit Information  Last  PT Received On: 02/10/14 Assistance Needed: +1 History of Present Illness: pt rpesents with L sided numbness.         Prior Functioning  Home Living Family/patient expects to be discharged to:: Private residence Living Arrangements: Alone Type of Home: House Home Access: Stairs to enter Secretary/administratorntrance Stairs-Number of Steps: 1 Home Layout: Two level;Bed/bath upstairs Alternate Level Stairs-Number of Steps: flight Alternate Level Stairs-Rails: Right Home Equipment: None Prior Function Level of Independence: Independent Communication Communication: No difficulties    Cognition  Cognition Arousal/Alertness: Awake/alert Behavior During Therapy: WFL for tasks assessed/performed Overall Cognitive Status: Within Functional Limits for tasks assessed    Extremity/Trunk Assessment Upper Extremity Assessment Upper Extremity Assessment: LUE deficits/detail LUE Deficits / Details: Only deficit is L ring finger still feels dull with diminished sensation.   Lower Extremity Assessment Lower Extremity Assessment: Overall WFL for tasks assessed   Balance Balance Overall balance assessment: Independent  End of Session PT - End of Session Activity Tolerance: Patient tolerated treatment well Patient left: in bed;with call bell/phone within reach (Sitting EOB) Nurse Communication: Mobility status  GP     Olivia Jefferson F 02/10/2014, 1:09 PM

## 2014-02-10 NOTE — Progress Notes (Addendum)
Chart reviewed.   [facility] PROGRESS NOTE  TYRONICA TRUXILLO ZOX:[id number] DOB: [birth year] DOA: 02/09/2014 PCP: Elie Confer, MD  Assessment/Plan:  Principal Problem:   Acute ischemic stroke. Symptoms improving. LDL 164. Statin started. On ASA. Await echo, carotid doppler Active Problems:   Hypertension   Dyslipidemia   Myalgia with statin Hypokalemia: replete  Code Status:  full Family Communication:   Disposition Plan:  home  Consultants:  neuro  Procedures:     Antibiotics:    HPI/Subjective: Left leg felt clumsy with walking. parasthesias gone  Objective: Filed Vitals:   02/10/14 0900  BP: 128/75  Pulse: 87  Temp: 98.1 F (36.7 C)  Resp: 18    Intake/Output Summary (Last 24 hours) at 02/10/14 1349 Last data filed at 02/10/14 0400  Gross per 24 hour  Intake      0 ml  Output    250 ml  Net   -250 ml   Filed Weights   02/09/14 1325 02/09/14 2249  Weight: 74.844 kg (165 lb) 79.788 kg (175 lb 14.4 oz)    Exam:   General:  Alert, oriented  Cardiovascular: RRR without MGR  Respiratory: CTA without WRR  Abdomen: S, NT, ND  Ext: no CCE  Neuro: CN, motor, sensation intact.  Basic Metabolic Panel:  Recent Labs Lab 02/09/14 1417 02/09/14 1545 02/10/14 0502  NA 138 139 139  K 3.3* 3.2* 3.4*  CL 96 96 95*  CO2 29  --  30  GLUCOSE 106* 107* 117*  BUN 13 13 14   CREATININE 0.82 0.90 0.82  CALCIUM 9.7  --  9.6   Liver Function Tests:  Recent Labs Lab 02/09/14 1417 02/10/14 0502  AST 16 15  ALT 24 22  ALKPHOS 115 107  BILITOT 0.7 0.7  PROT 7.9 7.2  ALBUMIN 3.8 3.3*   No results found for this basename: LIPASE, AMYLASE,  in the last 168 hours No results found for this basename: AMMONIA,  in the last 168 hours CBC:  Recent Labs Lab 02/09/14 1417 02/09/14 1545 02/10/14 0502  WBC 13.1*  --  12.0*  NEUTROABS 10.2*  --  7.9*  HGB 15.2* 16.3* 15.0  HCT 44.4 48.0* 43.7  MCV 84.6  --  85.4  PLT 274  --  248    Cardiac Enzymes: No results found for this basename: CKTOTAL, CKMB, CKMBINDEX, TROPONINI,  in the last 168 hours BNP (last 3 results) No results found for this basename: PROBNP,  in the last 8760 hours CBG:  Recent Labs Lab 02/09/14 1607  GLUCAP 119*    No results found for this or any previous visit (from the past 240 hour(s)).   Studies: Ct Head Wo Contrast  02/09/2014   CLINICAL DATA:  Left-sided weakness  EXAM: CT HEAD WITHOUT CONTRAST  TECHNIQUE: Contiguous axial images were obtained from the base of the skull through the vertex without intravenous contrast.  COMPARISON:  None.  FINDINGS: The brainstem, cerebellum, cerebral peduncles, thalamus, basal ganglia, basilar cisterns, and ventricular system appear within normal limits. No intracranial hemorrhage, mass lesion, or acute CVA.  IMPRESSION: No significant abnormality identified.   Electronically Signed   By: Herbie Baltimore M.D.   On: 02/09/2014 15:40   Mr Maxine Glenn Head Wo Contrast  02/09/2014   CLINICAL DATA:  Headache. Hypertension. Left lower extremity weakness.  EXAM: MRI HEAD WITHOUT CONTRAST  MRA HEAD WITHOUT CONTRAST  TECHNIQUE: Multiplanar, multiecho pulse sequences of the brain and surrounding structures were obtained without intravenous contrast. Angiographic images  of the head were obtained using MRA technique without contrast.  COMPARISON:  CT head without contrast 02/09/2014.  FINDINGS: MRI HEAD FINDINGS  An acute nonhemorrhagic infarct is present within the white matter of the posterior right corona radiata. This likely corresponds to the cortical spinal tracts. There is no associated hemorrhage. Minimal white matter changes evident. No other significant white matter disease is present. Midline structures are within normal limits.  Flow is present in the major intracranial arteries. The globes and orbits are intact. The paranasal sinuses and mastoid air cells are clear.  MRA HEAD FINDINGS  The internal carotid arteries are  within normal limits from the high cervical segments through the ICA termini bilaterally. The A1 and M1 segments are normal. The anterior communicating artery is patent. The MCA bifurcations are within normal limits. MCA and ACA branch vessels are normal bilaterally.  The right vertebral artery is dominant. The PICA origins are visualized and normal bilaterally. A prominent right AICA is present. The basilar artery is within normal limits. Both posterior cerebral arteries originate from the basilar tip. There is mild attenuation of right PCA branch vessels.  IMPRESSION: 1. Acute nonhemorrhagic infarct involving the white matter in the posterior right corona radiata, likely affecting the cortical spinal tracts. 2. No other significant infarct or white matter disease. 3. Minimal distal small vessel disease.  Critical Value/emergent results were called by telephone at the time of interpretation on 02/09/2014 at 6:37 PM to Dr. Rolland Porter , who verbally acknowledged these results.   Electronically Signed   By: Gennette Pac M.D.   On: 02/09/2014 18:37   Mr Brain Wo Contrast  02/09/2014   CLINICAL DATA:  Headache. Hypertension. Left lower extremity weakness.  EXAM: MRI HEAD WITHOUT CONTRAST  MRA HEAD WITHOUT CONTRAST  TECHNIQUE: Multiplanar, multiecho pulse sequences of the brain and surrounding structures were obtained without intravenous contrast. Angiographic images of the head were obtained using MRA technique without contrast.  COMPARISON:  CT head without contrast 02/09/2014.  FINDINGS: MRI HEAD FINDINGS  An acute nonhemorrhagic infarct is present within the white matter of the posterior right corona radiata. This likely corresponds to the cortical spinal tracts. There is no associated hemorrhage. Minimal white matter changes evident. No other significant white matter disease is present. Midline structures are within normal limits.  Flow is present in the major intracranial arteries. The globes and orbits are  intact. The paranasal sinuses and mastoid air cells are clear.  MRA HEAD FINDINGS  The internal carotid arteries are within normal limits from the high cervical segments through the ICA termini bilaterally. The A1 and M1 segments are normal. The anterior communicating artery is patent. The MCA bifurcations are within normal limits. MCA and ACA branch vessels are normal bilaterally.  The right vertebral artery is dominant. The PICA origins are visualized and normal bilaterally. A prominent right AICA is present. The basilar artery is within normal limits. Both posterior cerebral arteries originate from the basilar tip. There is mild attenuation of right PCA branch vessels.  IMPRESSION: 1. Acute nonhemorrhagic infarct involving the white matter in the posterior right corona radiata, likely affecting the cortical spinal tracts. 2. No other significant infarct or white matter disease. 3. Minimal distal small vessel disease.  Critical Value/emergent results were called by telephone at the time of interpretation on 02/09/2014 at 6:37 PM to Dr. Rolland Porter , who verbally acknowledged these results.   Electronically Signed   By: Gennette Pac M.D.   On: 02/09/2014 18:37  Scheduled Meds: . aspirin EC  325 mg Oral Daily  . enoxaparin (LOVENOX) injection  40 mg Subcutaneous Q24H  . simvastatin  20 mg Oral q1800   Continuous Infusions:   Time spent: 35 minutes  Maye Parkinson L  [facility] Pager [phone]. If 7PM-7AM, please contact night-coverage at [email or web address], password [email or web address] 02/10/2014, 1:49 PM  LOS: 1 day

## 2014-02-10 NOTE — Progress Notes (Signed)
Stroke Team Progress Note  HISTORY Olivia Jefferson is a 58 y.o. 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 At that time the symptoms consisted of left arm weakness and bilateral leg weakness. On 02/09/2014 she awoke feeling normal but around 0930 she noted her left arm was "tingling" and around 10:10 she noted she felt off balance and left leg seemed slightly "tingly". Due to these symptoms she called EMS. On arrival patient to the ED she was brought to the CT scanner where a code stroke was called. When seen by Dr. Leroy Kennedy she still felt her left arm and leg had decreased sensation but on formal testing she felt they were the same. NIHSS 0. Patient was very anxious.   Date last known well: Date: 02/09/2014  Time last known well: Time: 09:30  tPA Given: No: out of the window. Deficits resolved.   SUBJECTIVE There no family members present. The patient feels she is essentially back to baseline except for some mild numbness in one of the fingers on her left hand and mild difficulty with ambulation.  OBJECTIVE Most recent Vital Signs: Filed Vitals:   02/10/14 0056 02/10/14 0300 02/10/14 0515 02/10/14 0900  BP: 153/85 132/81 137/84 128/75  Pulse: 92 83 84 87  Temp: 98.4 F (36.9 C) 98.3 F (36.8 C) 97.9 F (36.6 C) 98.1 F (36.7 C)  TempSrc: Oral Oral Oral Oral  Resp: 18 18 18 18   Height:      Weight:      SpO2: 95% 95% 97% 98%   CBG (last 3)   Recent Labs  02/09/14 1607  GLUCAP 119*    IV Fluid Intake:     MEDICATIONS  . aspirin EC  325 mg Oral Daily  . enoxaparin (LOVENOX) injection  40 mg Subcutaneous Q24H  . influenza vac split quadrivalent PF  0.5 mL Intramuscular Tomorrow-1000  . simvastatin  20 mg Oral q1800   PRN:  acetaminophen, acetaminophen, LORazepam, senna-docusate  Diet:  Heart healthy diet with thin liquids Activity:  Up with assistance. DVT Prophylaxis:  Lovenox  CLINICALLY SIGNIFICANT STUDIES Basic Metabolic Panel:  Recent Labs Lab  02/09/14 1417 02/09/14 1545 02/10/14 0502  NA 138 139 139  K 3.3* 3.2* 3.4*  CL 96 96 95*  CO2 29  --  30  GLUCOSE 106* 107* 117*  BUN 13 13 14   CREATININE 0.82 0.90 0.82  CALCIUM 9.7  --  9.6   Liver Function Tests:  Recent Labs Lab 02/09/14 1417 02/10/14 0502  AST 16 15  ALT 24 22  ALKPHOS 115 107  BILITOT 0.7 0.7  PROT 7.9 7.2  ALBUMIN 3.8 3.3*   CBC:  Recent Labs Lab 02/09/14 1417 02/09/14 1545 02/10/14 0502  WBC 13.1*  --  12.0*  NEUTROABS 10.2*  --  7.9*  HGB 15.2* 16.3* 15.0  HCT 44.4 48.0* 43.7  MCV 84.6  --  85.4  PLT 274  --  248   Coagulation:  Recent Labs Lab 02/09/14 1417 02/10/14 0502  LABPROT 12.6 13.0  INR 0.96 1.00   Cardiac Enzymes: No results found for this basename: CKTOTAL, CKMB, CKMBINDEX, TROPONINI,  in the last 168 hours Urinalysis:  Recent Labs Lab 02/09/14 1522  COLORURINE YELLOW  LABSPEC 1.013  PHURINE 6.5  GLUCOSEU NEGATIVE  HGBUR SMALL*  BILIRUBINUR NEGATIVE  KETONESUR NEGATIVE  PROTEINUR 30*  UROBILINOGEN 0.2  NITRITE NEGATIVE  LEUKOCYTESUR NEGATIVE   Lipid Panel    Component Value Date/Time   CHOL 247*  02/10/2014 0502   TRIG 145 02/10/2014 0502   HDL 54 02/10/2014 0502   CHOLHDL 4.6 02/10/2014 0502   VLDL 29 02/10/2014 0502   LDLCALC 164* 02/10/2014 0502   HgbA1C  No results found for this basename: HGBA1C    Urine Drug Screen:     Component Value Date/Time   LABOPIA NONE DETECTED 02/09/2014 1522   COCAINSCRNUR NONE DETECTED 02/09/2014 1522   LABBENZ NONE DETECTED 02/09/2014 1522   AMPHETMU NONE DETECTED 02/09/2014 1522   THCU NONE DETECTED 02/09/2014 1522   LABBARB NONE DETECTED 02/09/2014 1522    Alcohol Level:  Recent Labs Lab 02/09/14 1417  ETH <11    Ct Head Wo Contrast 02/09/2014    No significant abnormality identified.     MRI /  Mra Head Wo Contrast 02/09/2014    1. Acute nonhemorrhagic infarct involving the white matter in the posterior right corona radiata, likely affecting the cortical  spinal tracts. 2. No other significant infarct or white matter disease. 3. Minimal distal small vessel disease.     2D Echocardiogram  pending  Carotid Doppler   pending  CXR    EKG  sinus rhythm rate 89 beats per minute. For complete results please see formal report.   Therapy Recommendations pending  Physical Exam    Mental Status:  Alert, oriented, thought content appropriate. Speech fluent without evidence of aphasia. Able to follow 3 step commands without difficulty.  Cranial Nerves:  II: Discs flat bilaterally; Visual fields grossly normal, pupils equal, round, reactive to light and accommodation  III,IV, VI: ptosis not present, extra-ocular motions intact bilaterally  V,VII: smile symmetric, facial light touch sensation normal bilaterally  VIII: hearing normal bilaterally  IX,X: gag reflex present  XI: bilateral shoulder shrug  XII: midline tongue extension without atrophy or fasciculations  Motor:  Right : Upper extremity 5/5 Left: Upper extremity 5/5  Lower extremity 5/5 Lower extremity 5/5  Tone and bulk:normal tone throughout; no atrophy noted  Sensory: Pinprick and light touch intact throughout, bilaterally  Deep Tendon Reflexes:  Right: Upper Extremity Left: Upper extremity  biceps (C-5 to C-6) 2/4 biceps (C-5 to C-6) 2/4  tricep (C7) 2/4 triceps (C7) 2/4  Brachioradialis (C6) 2/4 Brachioradialis (C6) 2/4  Lower Extremity Lower Extremity  quadriceps (L-2 to L-4) 2/4 quadriceps (L-2 to L-4) 2/4  Achilles (S1) 2/4 Achilles (S1) 2/4  Plantars:  Right: downgoing Left: downgoing  Cerebellar:  normal finger-to-nose, normal heel-to-shin test  Gait: slightly ataxic   ASSESSMENT Ms. Olivia GoodellCarol A Jefferson is a 58 y.o. female presenting with balance problems and left hemisensory deficits. TPA was not administered secondary to late arrival and resolving deficits. An MRI revealed an acute nonhemorrhagic infarct involving the white matter in the posterior right corona radiata,  likely affecting the cortical spinal tracts. Infarct felt to be thrombotic secondary to small vessel disease. On no antithrombotics prior to admission. Now on aspirin 325 mg orally every day for secondary stroke prevention. Patient with resultant ataxic gait. Work up underway.   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  Hyperlipidemia - cholesterol 247 LDL 164 - simvastatin started.  Hypertension history  Electrolyte abnormalities  Leukocytosis    Hospital day # 1  TREATMENT/PLAN  Continue aspirin 325 mg orally every day for secondary stroke prevention.  Await 2-D echo, carotid Dopplers, and hemoglobin A1c.  Await therapists evaluations.  Delton Seeavid Rinehuls PA-C [facility] Pager [phone] 02/10/2014, 9:36 AM  I have personally obtained a history, examined the patient,  evaluated imaging results, and formulated the assessment and plan of care. I agree with the above.  Stroke w/up as above Pauletta Browns

## 2014-02-10 NOTE — Progress Notes (Signed)
OT Cancellation Note  Patient Details Name: Lorrin GoodellCarol A Munce MRN: [id number] DOB: 11/14/1956   Cancelled Treatment:    Reason Eval/Treat Not Completed: OT screened, no needs identified, will sign off  PT notified OT that pt has no OT needs. Will sign off.  Earlie RavelingStraub, Genise Strack L OTR/L [phone] 02/10/2014, 11:51 AM

## 2014-02-11 DIAGNOSIS — I519 Heart disease, unspecified: Secondary | ICD-10-CM

## 2014-02-11 MED ORDER — ASPIRIN 325 MG PO TBEC: 325.0000 mg | DELAYED_RELEASE_TABLET | Freq: Every day | ORAL | Status: AC

## 2014-02-11 MED ORDER — TRIAMTERENE-HCTZ 37.5-25 MG PO CAPS: 1.0000 | ORAL_CAPSULE | Freq: Every day | ORAL | Status: AC

## 2014-02-11 MED ORDER — SIMVASTATIN 10 MG PO TABS: 10.0000 mg | ORAL_TABLET | Freq: Every day | ORAL | Status: AC

## 2014-02-11 NOTE — Progress Notes (Signed)
Pt A&O x4; Pt discharge instructions and education completed with pt and family at side in room. All denies any questions and voices understanding. Pt lines including IV and telemetry removed from pt. Pt provided handout information on Cholesterol diet and the pamphlet on "stroke and cholesterol". Pt transported off unit via wheelchair with belongings and family at side.

## 2014-02-11 NOTE — Discharge Summary (Signed)
Physician Discharge Summary  Olivia Jefferson WUJ:[id number] DOB: May 25, 1956 DOA: 02/09/2014  PCP: Elie ConferWESTERMANN,[name], MD  Admit date: 02/09/2014 Discharge date: 02/11/2014  Time spent: *greater than 30 mintues  Recommendations for Outpatient Follow-up:  1. Monitor for myalgias on statin, check LFTs. 2. Repeat carotid dopplers in one year  Discharge Diagnoses:  Principal Problem:   Acute CVA (cerebrovascular accident) Active Problems:   Hypertension   Dyslipidemia  h/o Myalgia with pravachol   Discharge Condition: stable  Filed Weights   02/09/14 1325 02/09/14 2249  Weight: 74.844 kg (165 lb) 79.788 kg (175 lb 14.4 oz)    History of present illness:   58 y.o. female with Past medical history of hypertension, dyslipidemia, myalgia on statin.  The patient is coming from home.  The pt presented with complain of left sided numbness, that she noted earlier this morning at 9:30 AM. When she woke up she was at her baseline and did not have any complaint. Her symptoms initially started in the left upper extremity with numbness and she tried to shake it off but his symptoms were not getting better and then while she was going to the bathroom she noticed that she had difficulty ambulating due to weakness and numbness in her legs. She denied any dizziness, headache, blurring of vision, difficulty swallowing, chest pain, palpitation, shortness of breath, fever, chills, burning urination, diarrhea, rash.  She has history of rosacea and has photosensitivity.  She denies any similar symptoms in the past.  She has history of hypertension and is taking her medication, she was on pravastatin which she stopped a few months ago due to myalgia and generalized fatigue and difficulty maintaining coordination and declining cognitive function. She after stopping the medication her symptoms improved.  She has family history of strokes  Hospital Course:  Admitted to telemetry. Neurology consulted. PT, OT  consulted.  Workup revealed acute nonhemorrhagic infarct involving the white matter in the posterior right corona radiata on MRI. Started on ASA 325 mg daily.  LDL>100, so started on low dose lipitor. Has had myalgias on pravachol in the past, but pt wanted to try another statin.  Carotid dopplers showed moderate plaque on right, but no critical stenosis. Echo showed no source of embolus.  Patients symptoms nearly resolved by discharge.    Procedures:  none  Consultations: neurology  Discharge Exam: Filed Vitals:   02/11/14 1336  BP: 153/94  Pulse: 101  Temp: 98.4 F (36.9 C)  Resp: 18    Neuro: cranial nerves, motor strength WNL  Discharge Instructions  Discharge Orders   Future Orders Complete By Expires   Activity as tolerated - No restrictions  As directed    Discharge instructions  As directed    Comments:     LOW CHOLESTEROL       Medication List         aspirin 325 MG EC tablet  Take 1 tablet (325 mg total) by mouth daily.     simvastatin 10 MG tablet  Commonly known as:  ZOCOR  Take 1 tablet (10 mg total) by mouth daily at 6 PM.     triamterene-hydrochlorothiazide 37.5-25 MG per capsule  Commonly known as:  DYAZIDE  Take 1 each (1 capsule total) by mouth daily.       No Known Allergies     Follow-up Information   Follow up with Gates RiggSETHI,PRAMODKUMAR P, MD. Schedule an appointment as soon as possible for a visit in 2 months.   Specialties:  Neurology, Radiology  Contact information:   [address] [place] [address] [phone]       Follow up with Elie Confer, MD In 3 weeks.   Specialty:  Family Medicine   Contact information:   [address] [place] [address] [phone]        The results of significant diagnostics from this hospitalization (including imaging, microbiology, ancillary and laboratory) are listed below for reference.    Significant Diagnostic Studies: Ct Head Wo Contrast  02/09/2014    CLINICAL DATA:  Left-sided weakness  EXAM: CT HEAD WITHOUT CONTRAST  TECHNIQUE: Contiguous axial images were obtained from the base of the skull through the vertex without intravenous contrast.  COMPARISON:  None.  FINDINGS: The brainstem, cerebellum, cerebral peduncles, thalamus, basal ganglia, basilar cisterns, and ventricular system appear within normal limits. No intracranial hemorrhage, mass lesion, or acute CVA.  IMPRESSION: No significant abnormality identified.   Electronically Signed   By: Herbie Baltimore M.D.   On: 02/09/2014 15:40   Mr Maxine Glenn Head Wo Contrast  02/09/2014   CLINICAL DATA:  Headache. Hypertension. Left lower extremity weakness.  EXAM: MRI HEAD WITHOUT CONTRAST  MRA HEAD WITHOUT CONTRAST  TECHNIQUE: Multiplanar, multiecho pulse sequences of the brain and surrounding structures were obtained without intravenous contrast. Angiographic images of the head were obtained using MRA technique without contrast.  COMPARISON:  CT head without contrast 02/09/2014.  FINDINGS: MRI HEAD FINDINGS  An acute nonhemorrhagic infarct is present within the white matter of the posterior right corona radiata. This likely corresponds to the cortical spinal tracts. There is no associated hemorrhage. Minimal white matter changes evident. No other significant white matter disease is present. Midline structures are within normal limits.  Flow is present in the major intracranial arteries. The globes and orbits are intact. The paranasal sinuses and mastoid air cells are clear.  MRA HEAD FINDINGS  The internal carotid arteries are within normal limits from the high cervical segments through the ICA termini bilaterally. The A1 and M1 segments are normal. The anterior communicating artery is patent. The MCA bifurcations are within normal limits. MCA and ACA branch vessels are normal bilaterally.  The right vertebral artery is dominant. The PICA origins are visualized and normal bilaterally. A prominent right AICA is  present. The basilar artery is within normal limits. Both posterior cerebral arteries originate from the basilar tip. There is mild attenuation of right PCA branch vessels.  IMPRESSION: 1. Acute nonhemorrhagic infarct involving the white matter in the posterior right corona radiata, likely affecting the cortical spinal tracts. 2. No other significant infarct or white matter disease. 3. Minimal distal small vessel disease.  Critical Value/emergent results were called by telephone at the time of interpretation on 02/09/2014 at 6:37 PM to Dr. Rolland Porter , who verbally acknowledged these results.   Electronically Signed   By: Gennette Pac M.D.   On: 02/09/2014 18:37   Mr Brain Wo Contrast  02/09/2014   CLINICAL DATA:  Headache. Hypertension. Left lower extremity weakness.  EXAM: MRI HEAD WITHOUT CONTRAST  MRA HEAD WITHOUT CONTRAST  TECHNIQUE: Multiplanar, multiecho pulse sequences of the brain and surrounding structures were obtained without intravenous contrast. Angiographic images of the head were obtained using MRA technique without contrast.  COMPARISON:  CT head without contrast 02/09/2014.  FINDINGS: MRI HEAD FINDINGS  An acute nonhemorrhagic infarct is present within the white matter of the posterior right corona radiata. This likely corresponds to the cortical spinal tracts. There is no associated hemorrhage. Minimal  white matter changes evident. No other significant white matter disease is present. Midline structures are within normal limits.  Flow is present in the major intracranial arteries. The globes and orbits are intact. The paranasal sinuses and mastoid air cells are clear.  MRA HEAD FINDINGS  The internal carotid arteries are within normal limits from the high cervical segments through the ICA termini bilaterally. The A1 and M1 segments are normal. The anterior communicating artery is patent. The MCA bifurcations are within normal limits. MCA and ACA branch vessels are normal bilaterally.  The  right vertebral artery is dominant. The PICA origins are visualized and normal bilaterally. A prominent right AICA is present. The basilar artery is within normal limits. Both posterior cerebral arteries originate from the basilar tip. There is mild attenuation of right PCA branch vessels.  IMPRESSION: 1. Acute nonhemorrhagic infarct involving the white matter in the posterior right corona radiata, likely affecting the cortical spinal tracts. 2. No other significant infarct or white matter disease. 3. Minimal distal small vessel disease.  Critical Value/emergent results were called by telephone at the time of interpretation on 02/09/2014 at 6:37 PM to Dr. Rolland Porter , who verbally acknowledged these results.   Electronically Signed   By: Gennette Pac M.D.   On: 02/09/2014 18:37   EKG:  NSR  Echo Left ventricle: The cavity size was normal. Wall thickness was normal. Systolic function was normal. The estimated ejection fraction was in the range of 55% to 60%. Wall motion was normal; there were no regional wall motion abnormalities. Doppler parameters are consistent with abnormal left ventricular relaxation (grade 1 diastolic dysfunction). - Aortic valve: Mildly calcified annulus. Trileaflet. No significant regurgitation. - Right atrium: Central venous pressure: 3mm Hg (est). - Atrial septum: No defect or patent foramen ovale was identified. - Tricuspid valve: Trivial regurgitation. - Pericardium, extracardiac: There was no pericardial effusion. Impressions:  - Normal LV wall thickness with LVEF 55-60%, grade 1 diastolic dysfunction. Trivial tricuspid regurgitation. Unable to assess PASP. No obvious PFO or ASD.  Carotid doppler Right: moderate homogenous plaque origin ICA. 40-59% ICA stenosis. Left: mild mixed plaque origin ICA. 1-39% ICA stenosis. Vertebral artery flow is antegrade, bilaterally.   Microbiology: No results found for this or any previous visit (from the past 240  hour(s)).   Labs: Basic Metabolic Panel:  Recent Labs Lab 02/09/14 1417 02/09/14 1545 02/10/14 0502  NA 138 139 139  K 3.3* 3.2* 3.4*  CL 96 96 95*  CO2 29  --  30  GLUCOSE 106* 107* 117*  BUN 13 13 14   CREATININE 0.82 0.90 0.82  CALCIUM 9.7  --  9.6   Liver Function Tests:  Recent Labs Lab 02/09/14 1417 02/10/14 0502  AST 16 15  ALT 24 22  ALKPHOS 115 107  BILITOT 0.7 0.7  PROT 7.9 7.2  ALBUMIN 3.8 3.3*   No results found for this basename: LIPASE, AMYLASE,  in the last 168 hours No results found for this basename: AMMONIA,  in the last 168 hours CBC:  Recent Labs Lab 02/09/14 1417 02/09/14 1545 02/10/14 0502  WBC 13.1*  --  12.0*  NEUTROABS 10.2*  --  7.9*  HGB 15.2* 16.3* 15.0  HCT 44.4 48.0* 43.7  MCV 84.6  --  85.4  PLT 274  --  248   Cardiac Enzymes: No results found for this basename: CKTOTAL, CKMB, CKMBINDEX, TROPONINI,  in the last 168 hours BNP: BNP (last 3 results) No results found for this basename: PROBNP,  in the last 8760 hours CBG:  Recent Labs Lab 02/09/14 1607  GLUCAP 119*   Lipid Panel     Component Value Date/Time   CHOL 247* 02/10/2014 0502   TRIG 145 02/10/2014 0502   HDL 54 02/10/2014 0502   CHOLHDL 4.6 02/10/2014 0502   VLDL 29 02/10/2014 0502   LDLCALC 164* 02/10/2014 0502       Signed:  Challen Spainhour L  [facility] 02/11/2014, 3:18 PM

## 2014-02-11 NOTE — Progress Notes (Signed)
Pt informs nurse Hydrographic surveyor(writer) as Clinical research associatewriter walked into the room stating "I took my diuretic pill already this morning because they told me keep on taking it". Pt was asked about the pill she took and informed it was not part of her medication list here but pt stated it was her home med and she has been taking it every am. Pt was then informed to have given the medication to RN to have sent it off to pharmacy for labelling and dispensing so we could give them to her. Pt said she had been taking them(diuretics) regularly and that it was remain with only two pills in which she just taken one and one more is left. Self asked for the medication so its send to pharmacy but; Pt medication was not in the right right bottle and difficult to identify medicine. Neurology PA Onalee HuaDavid was informed of the situation.

## 2014-02-11 NOTE — Progress Notes (Signed)
  Echocardiogram 2D Echocardiogram has been performed.  Olivia Jefferson, Olivia Jefferson 02/11/2014, 2:52 PM

## 2014-02-11 NOTE — Progress Notes (Signed)
Stroke Team Progress Note  HISTORY Olivia Jefferson is a 58 y.o. 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 At that time the symptoms consisted of left arm weakness and bilateral leg weakness. On 02/09/2014 she awoke feeling normal but around 0930 she noted her left arm was "tingling" and around 10:10 she noted she felt off balance and left leg seemed slightly "tingly". Due to these symptoms she called EMS. On arrival patient to the ED she was brought to the CT scanner where a code stroke was called. When seen by Dr. Leroy Kennedyamilo she still felt her left arm and leg had decreased sensation but on formal testing she felt they were the same. NIHSS 0. Patient was very anxious.   Date last known well: Date: 02/09/2014  Time last known well: Time: 09:30  tPA Given: No: out of the window. Deficits resolved.   SUBJECTIVE There no family members present. The patient is without complaints. Apparently she's been taking her Dyazide from home while in the hospital. We instructed her not to take this medication for now due to her recent stroke. Resume Dyazide in several days. The patient also reports that she has no medical insurance. We will ask a Child psychotherapistsocial worker to meet with the patient.  OBJECTIVE Most recent Vital Signs: Filed Vitals:   02/10/14 2124 02/11/14 0223 02/11/14 0536 02/11/14 0922  BP: 146/92 129/80 144/84 134/84  Pulse: 101 92 84 101  Temp: 98.4 F (36.9 C) 98.3 F (36.8 C) 97.9 F (36.6 C) 98.7 F (37.1 C)  TempSrc: Oral Oral Oral Oral  Resp: 18 18 18 18   Height:      Weight:      SpO2: 98% 94% 98% 98%   CBG (last 3)   Recent Labs  02/09/14 1607  GLUCAP 119*    IV Fluid Intake:     MEDICATIONS  . aspirin EC  325 mg Oral Daily  . enoxaparin (LOVENOX) injection  40 mg Subcutaneous Q24H  . simvastatin  10 mg Oral q1800   PRN:  acetaminophen, acetaminophen, LORazepam, senna-docusate  Diet:  Heart healthy diet with thin liquids Activity:  Up with  assistance. DVT Prophylaxis:  Lovenox  CLINICALLY SIGNIFICANT STUDIES Basic Metabolic Panel:   Recent Labs Lab 02/09/14 1417 02/09/14 1545 02/10/14 0502  NA 138 139 139  K 3.3* 3.2* 3.4*  CL 96 96 95*  CO2 29  --  30  GLUCOSE 106* 107* 117*  BUN 13 13 14   CREATININE 0.82 0.90 0.82  CALCIUM 9.7  --  9.6   Liver Function Tests:   Recent Labs Lab 02/09/14 1417 02/10/14 0502  AST 16 15  ALT 24 22  ALKPHOS 115 107  BILITOT 0.7 0.7  PROT 7.9 7.2  ALBUMIN 3.8 3.3*   CBC:   Recent Labs Lab 02/09/14 1417 02/09/14 1545 02/10/14 0502  WBC 13.1*  --  12.0*  NEUTROABS 10.2*  --  7.9*  HGB 15.2* 16.3* 15.0  HCT 44.4 48.0* 43.7  MCV 84.6  --  85.4  PLT 274  --  248   Coagulation:   Recent Labs Lab 02/09/14 1417 02/10/14 0502  LABPROT 12.6 13.0  INR 0.96 1.00   Cardiac Enzymes: No results found for this basename: CKTOTAL, CKMB, CKMBINDEX, TROPONINI,  in the last 168 hours Urinalysis:   Recent Labs Lab 02/09/14 1522  COLORURINE YELLOW  LABSPEC 1.013  PHURINE 6.5  GLUCOSEU NEGATIVE  HGBUR SMALL*  BILIRUBINUR NEGATIVE  KETONESUR NEGATIVE  PROTEINUR 30*  UROBILINOGEN 0.2  NITRITE NEGATIVE  LEUKOCYTESUR NEGATIVE   Lipid Panel    Component Value Date/Time   CHOL 247* 02/10/2014 0502   TRIG 145 02/10/2014 0502   HDL 54 02/10/2014 0502   CHOLHDL 4.6 02/10/2014 0502   VLDL 29 02/10/2014 0502   LDLCALC 164* 02/10/2014 0502   HgbA1C  Lab Results  Component Value Date   HGBA1C 6.0* 02/10/2014    Urine Drug Screen:     Component Value Date/Time   LABOPIA NONE DETECTED 02/09/2014 1522   COCAINSCRNUR NONE DETECTED 02/09/2014 1522   LABBENZ NONE DETECTED 02/09/2014 1522   AMPHETMU NONE DETECTED 02/09/2014 1522   THCU NONE DETECTED 02/09/2014 1522   LABBARB NONE DETECTED 02/09/2014 1522    Alcohol Level:   Recent Labs Lab 02/09/14 1417  ETH <11    Ct Head Wo Contrast 02/09/2014    No significant abnormality identified.     MRI /  Mra Head Wo  Contrast 02/09/2014    1. Acute nonhemorrhagic infarct involving the white matter in the posterior right corona radiata, likely affecting the cortical spinal tracts. 2. No other significant infarct or white matter disease. 3. Minimal distal small vessel disease.     2D Echocardiogram  pending  Carotid Doppler   pending  CXR    EKG  sinus rhythm rate 89 beats per minute. For complete results please see formal report.   Therapy Recommendations no physical or occupational therapy followup is recommended  Physical Exam    Mental Status:  Alert, oriented, thought content appropriate. Speech fluent without evidence of aphasia. Able to follow 3 step commands without difficulty.  Cranial Nerves:  II: Discs flat bilaterally; Visual fields grossly normal, pupils equal, round, reactive to light and accommodation  III,IV, VI: ptosis not present, extra-ocular motions intact bilaterally  V,VII: smile symmetric, facial light touch sensation normal bilaterally  VIII: hearing normal bilaterally  IX,X: gag reflex present  XI: bilateral shoulder shrug  XII: midline tongue extension without atrophy or fasciculations  Motor:  Right : Upper extremity 5/5 Left: Upper extremity 5/5  Lower extremity 5/5 Lower extremity 5/5  Tone and bulk:normal tone throughout; no atrophy noted  Sensory: Pinprick and light touch intact throughout, bilaterally  Deep Tendon Reflexes:  Right: Upper Extremity Left: Upper extremity  biceps (C-5 to C-6) 2/4 biceps (C-5 to C-6) 2/4  tricep (C7) 2/4 triceps (C7) 2/4  Brachioradialis (C6) 2/4 Brachioradialis (C6) 2/4  Lower Extremity Lower Extremity  quadriceps (L-2 to L-4) 2/4 quadriceps (L-2 to L-4) 2/4  Achilles (S1) 2/4 Achilles (S1) 2/4  Plantars:  Right: downgoing Left: downgoing  Cerebellar:  normal finger-to-nose, normal heel-to-shin test  Gait: slightly ataxic   ASSESSMENT Ms. Olivia Jefferson is a 58 y.o. female presenting with balance problems and left  hemisensory deficits. TPA was not administered secondary to late arrival and resolving deficits. An MRI revealed an acute nonhemorrhagic infarct involving the white matter in the posterior right corona radiata, likely affecting the cortical spinal tracts. Infarct felt to be thrombotic secondary to small vessel disease. On no antithrombotics prior to admission. Now on aspirin 325 mg orally every day for secondary stroke prevention. Patient with resultant ataxic gait. Work up underway.   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  Hyperlipidemia - cholesterol 247 LDL 164 - simvastatin started.  Hypertension history  Electrolyte abnormalities  Mild Leukocytosis - improving  Hemoglobin A1c 6.0  Hospital day # 2  TREATMENT/PLAN  Continue aspirin 325 mg orally every day for  secondary stroke prevention.  Await 2-D echo and carotid Dopplers.  No further PT or OT recommended.  Social worker consult for assistance with medical insurance.  Resume Dyazide and several days.  Discharge soon. Followup Dr. Pearlean Brownie in 2 months.  Delton See PA-C [facility] Pager [phone] 02/11/2014, 9:26 AM  I have personally obtained a history, examined the patient, evaluated imaging results, and formulated the assessment and plan of care. I agree with the above.  No signs of hemodynamic stenosis b/l carotids. Echo done earlier today. Pt close to baseline would like to see someone for insurance reasons. States has no insurance and can't afford to see out pt follow up.  Pauletta Browns

## 2014-02-11 NOTE — Progress Notes (Signed)
VASCULAR LAB PRELIMINARY  PRELIMINARY  PRELIMINARY  PRELIMINARY  Carotid Dopplers completed.    Preliminary report:  40-59% right ICA stenosis.  1-39% left ICA stenosis.  Vertebral artery flow is antegrade.  Thresia Ramanathan, RVT 02/11/2014, 11:45 AM

## 2014-02-12 LAB — ANA: Anti Nuclear Antibody(ANA): NEGATIVE

## 2014-04-26 ENCOUNTER — Ambulatory Visit: Payer: MEDICAID | Admitting: Neurology

## 2015-05-08 IMAGING — CT CT HEAD W/O CM
1 series · 16 of 30 positions shown, 20 images · non-contrast
Comparison: None.

CLINICAL DATA: Left-sided weakness

EXAM:
CT HEAD WITHOUT CONTRAST
TECHNIQUE: Contiguous axial images were obtained from the base of the skull
through the vertex without intravenous contrast.

[Series 2: head 5.0 h30s · axial · 0.41mm/px · z∈[-101,+39]mm · 16 of 32 slices shown, 20 images]
[im 2/32  brain]
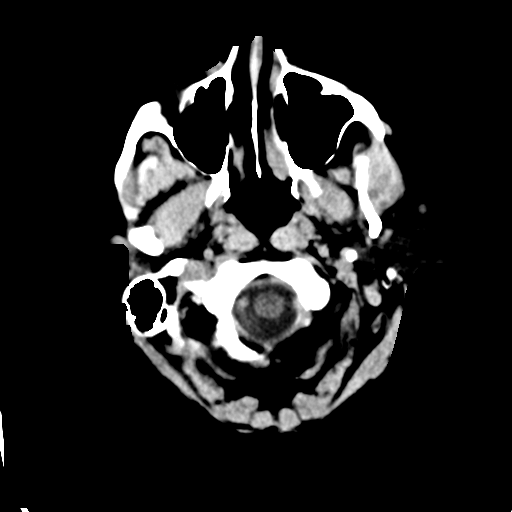
[im 2/32  bone]
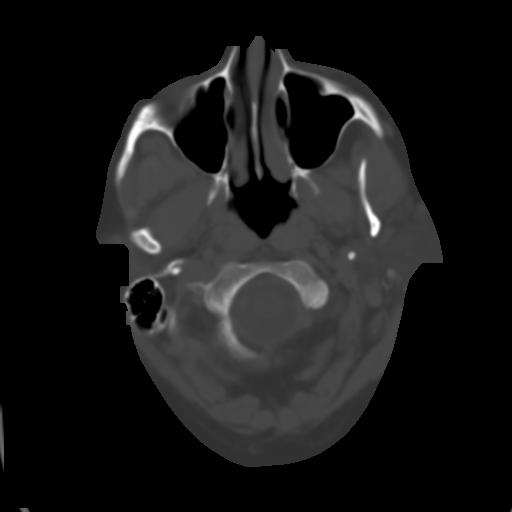
[im 4/32  brain]
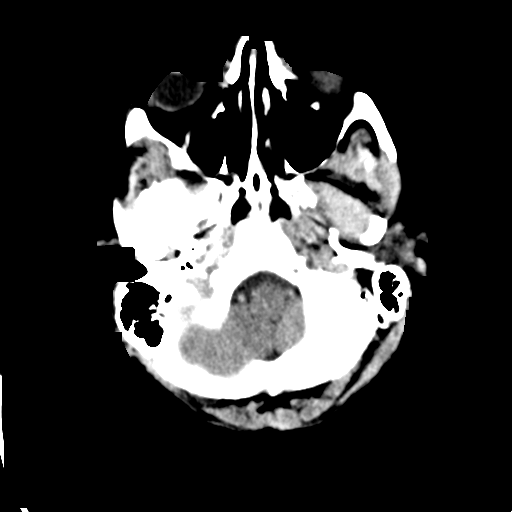
[im 6/32  brain]
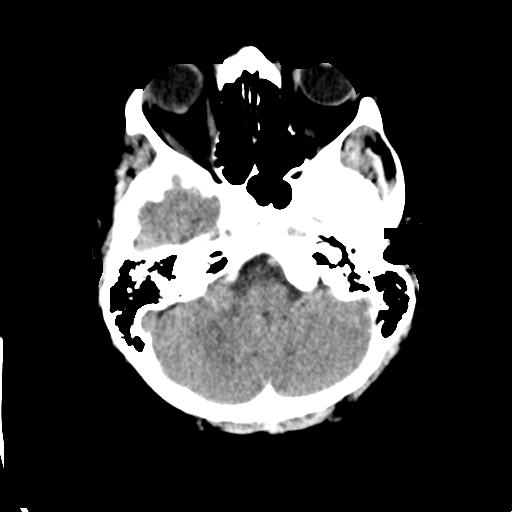
[im 8/32  brain]
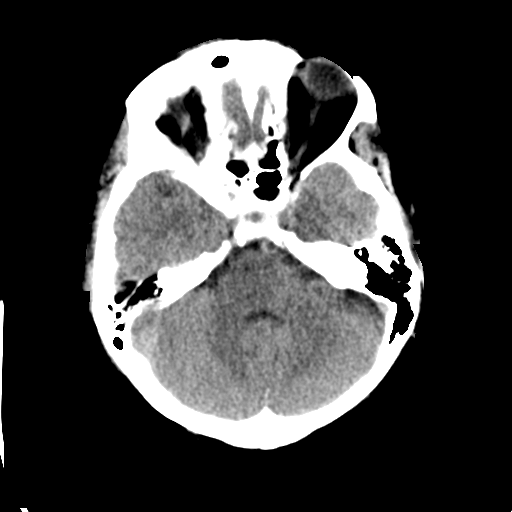
[im 9/32  brain]
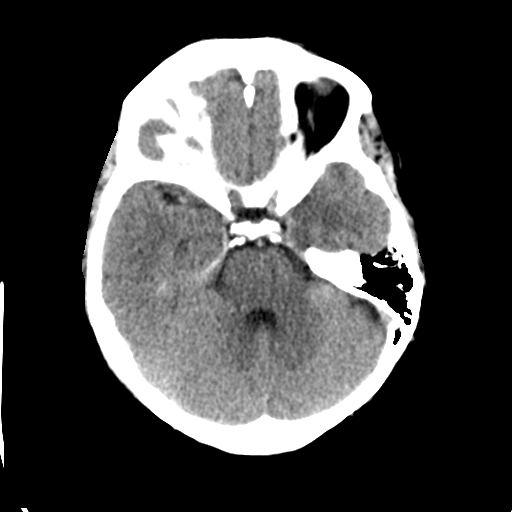
[im 9/32  bone]
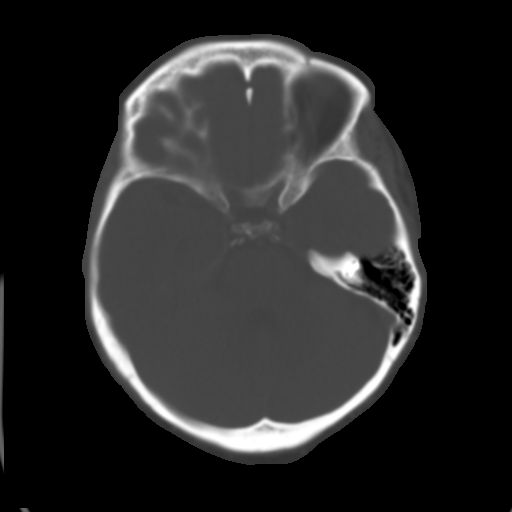
[im 11/32  brain]
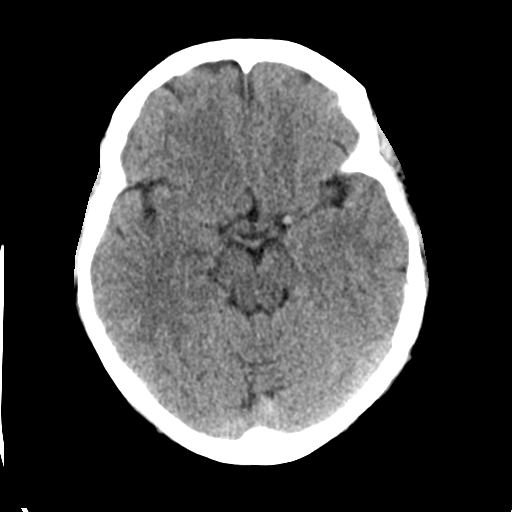
[im 13/32  brain]
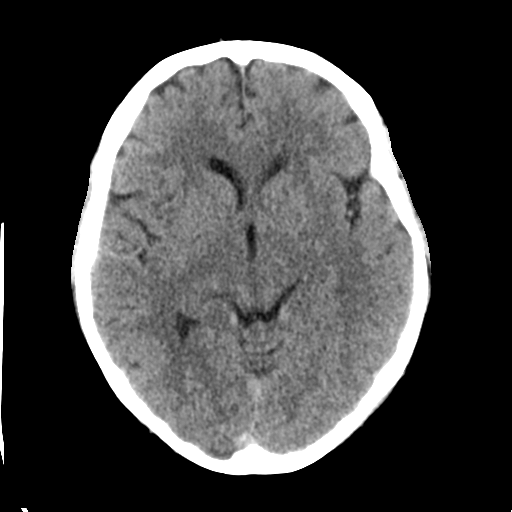
[im 15/32  brain]
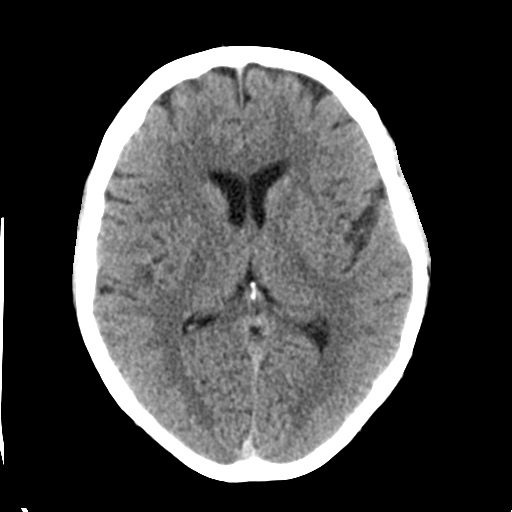
[im 17/32  brain]
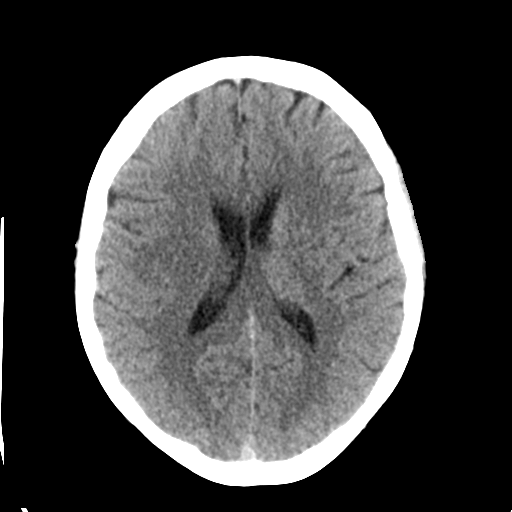
[im 17/32  bone]
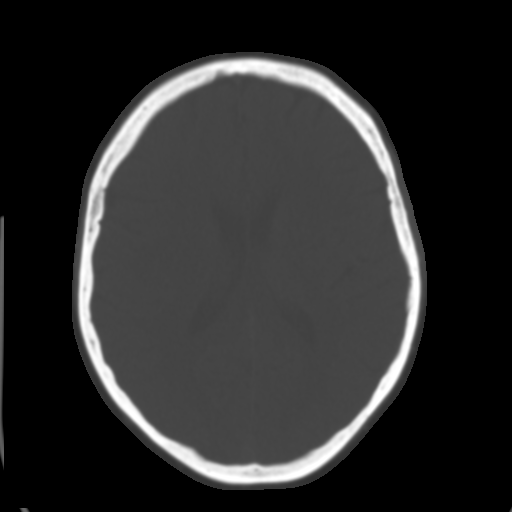
[im 19/32  brain]
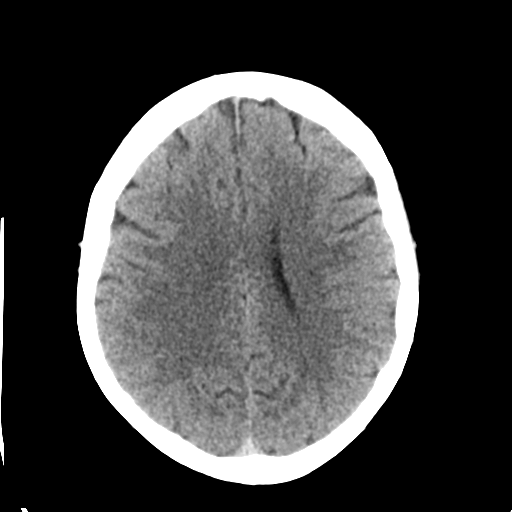
[im 21/32  brain]
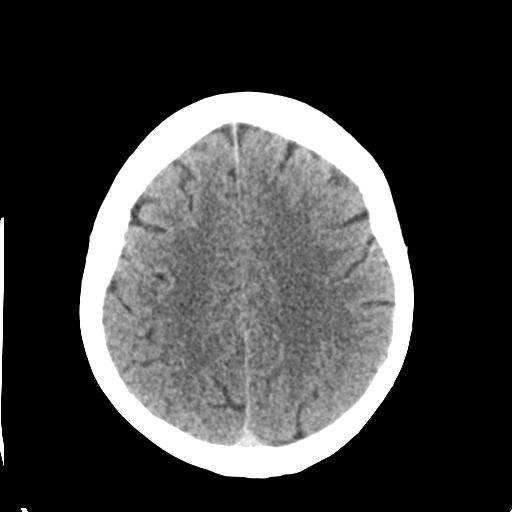
[im 23/32  brain]
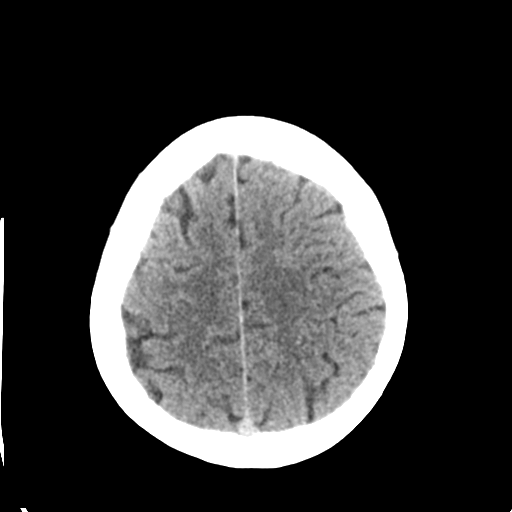
[im 24/32  brain]
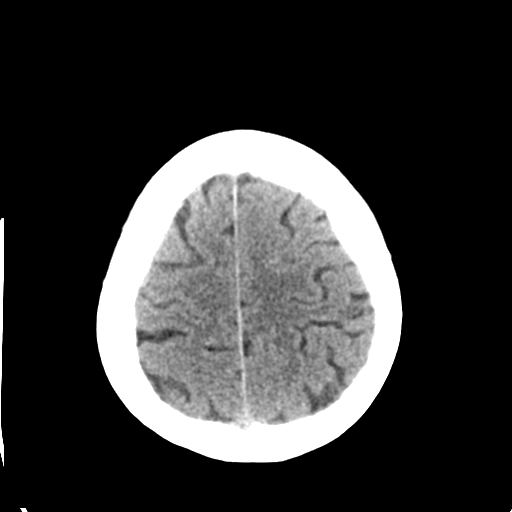
[im 24/32  bone]
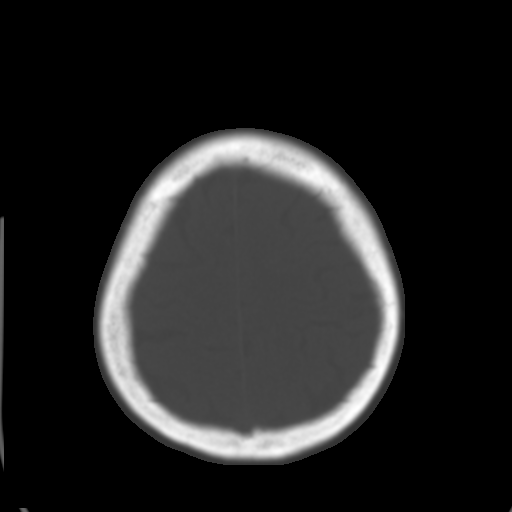
[im 26/32  brain]
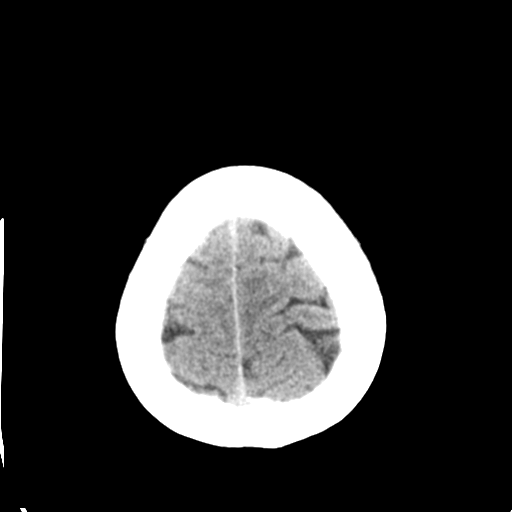
[im 28/32  brain]
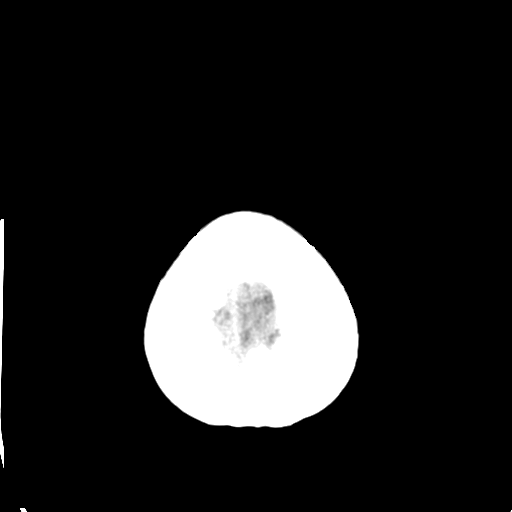
[im 30/32  brain]
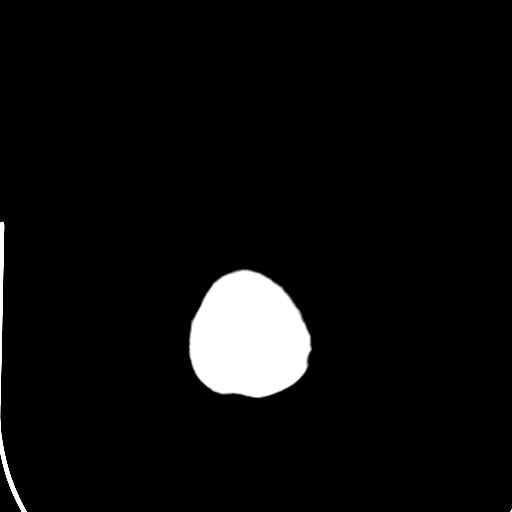

[16 of 30 positions shown; findings below may reference images not displayed]

FINDINGS: The brainstem, cerebellum, cerebral peduncles, thalamus, basal
ganglia, basilar cisterns, and ventricular system appear within
normal limits. No intracranial hemorrhage, mass lesion, or acute
CVA.
IMPRESSION: No significant abnormality identified.
